# Patient Record
Sex: Male | Born: 1965 | Race: White | Hispanic: No | Smoking: Current every day smoker
Health system: Southern US, Community
[De-identification: ages and names within clinical notes are randomized; demographics above are authoritative.]

## PROBLEM LIST (undated history)

## (undated) DIAGNOSIS — K859 Acute pancreatitis without necrosis or infection, unspecified: Secondary | ICD-10-CM

## (undated) DIAGNOSIS — K4090 Unilateral inguinal hernia, without obstruction or gangrene, not specified as recurrent: Secondary | ICD-10-CM

## (undated) HISTORY — PX: OTHER SURGICAL HISTORY: SHX169

---

## 2004-11-03 ENCOUNTER — Emergency Department: Payer: Self-pay | Admitting: Emergency Medicine

## 2005-02-22 ENCOUNTER — Emergency Department: Payer: Self-pay | Admitting: Emergency Medicine

## 2005-02-28 ENCOUNTER — Emergency Department: Payer: Self-pay | Admitting: Unknown Physician Specialty

## 2005-04-30 ENCOUNTER — Emergency Department: Payer: Self-pay | Admitting: Emergency Medicine

## 2005-09-23 ENCOUNTER — Emergency Department: Payer: Self-pay | Admitting: Emergency Medicine

## 2005-12-08 ENCOUNTER — Emergency Department: Payer: Self-pay | Admitting: Emergency Medicine

## 2005-12-20 ENCOUNTER — Emergency Department: Payer: Self-pay | Admitting: Emergency Medicine

## 2006-01-22 ENCOUNTER — Emergency Department: Payer: Self-pay | Admitting: Emergency Medicine

## 2006-03-14 ENCOUNTER — Emergency Department: Payer: Self-pay | Admitting: Emergency Medicine

## 2006-03-24 ENCOUNTER — Emergency Department: Payer: Self-pay | Admitting: Emergency Medicine

## 2006-07-29 ENCOUNTER — Emergency Department: Payer: Self-pay | Admitting: Emergency Medicine

## 2007-01-01 ENCOUNTER — Emergency Department: Payer: Self-pay | Admitting: Emergency Medicine

## 2007-01-27 ENCOUNTER — Emergency Department: Payer: Self-pay | Admitting: Emergency Medicine

## 2007-05-16 ENCOUNTER — Emergency Department: Payer: Self-pay | Admitting: Emergency Medicine

## 2007-06-15 ENCOUNTER — Emergency Department: Payer: Self-pay | Admitting: Emergency Medicine

## 2007-06-26 ENCOUNTER — Emergency Department: Payer: Self-pay | Admitting: Emergency Medicine

## 2007-07-21 ENCOUNTER — Emergency Department: Payer: Self-pay | Admitting: Emergency Medicine

## 2007-07-22 ENCOUNTER — Emergency Department: Payer: Self-pay | Admitting: Emergency Medicine

## 2007-07-30 ENCOUNTER — Ambulatory Visit: Payer: Self-pay | Admitting: Endocrinology

## 2007-08-31 ENCOUNTER — Ambulatory Visit: Payer: Self-pay | Admitting: Gastroenterology

## 2007-09-16 ENCOUNTER — Ambulatory Visit: Payer: Self-pay | Admitting: Gastroenterology

## 2008-01-04 ENCOUNTER — Emergency Department: Payer: Self-pay | Admitting: Internal Medicine

## 2008-01-14 ENCOUNTER — Emergency Department: Payer: Self-pay | Admitting: Emergency Medicine

## 2008-01-19 ENCOUNTER — Emergency Department: Payer: Self-pay | Admitting: Emergency Medicine

## 2008-01-20 ENCOUNTER — Emergency Department: Payer: Self-pay | Admitting: Internal Medicine

## 2008-03-24 ENCOUNTER — Emergency Department: Payer: Self-pay | Admitting: Emergency Medicine

## 2008-04-11 ENCOUNTER — Emergency Department: Payer: Self-pay | Admitting: Emergency Medicine

## 2008-05-11 ENCOUNTER — Emergency Department: Payer: Self-pay | Admitting: Emergency Medicine

## 2008-07-04 ENCOUNTER — Emergency Department: Payer: Self-pay | Admitting: Emergency Medicine

## 2008-07-27 ENCOUNTER — Emergency Department: Payer: Self-pay | Admitting: Emergency Medicine

## 2009-03-11 ENCOUNTER — Emergency Department: Payer: Self-pay | Admitting: Emergency Medicine

## 2009-05-05 ENCOUNTER — Emergency Department: Payer: Self-pay | Admitting: Emergency Medicine

## 2009-05-26 ENCOUNTER — Emergency Department: Payer: Self-pay | Admitting: Emergency Medicine

## 2009-06-04 ENCOUNTER — Emergency Department: Payer: Self-pay | Admitting: Emergency Medicine

## 2009-06-21 ENCOUNTER — Emergency Department: Payer: Self-pay | Admitting: Emergency Medicine

## 2009-07-12 ENCOUNTER — Emergency Department: Payer: Self-pay | Admitting: Emergency Medicine

## 2009-09-09 ENCOUNTER — Emergency Department: Payer: Self-pay | Admitting: Emergency Medicine

## 2009-11-28 ENCOUNTER — Emergency Department: Payer: Self-pay | Admitting: Emergency Medicine

## 2010-12-26 ENCOUNTER — Emergency Department: Payer: Self-pay | Admitting: Emergency Medicine

## 2013-08-23 ENCOUNTER — Emergency Department: Payer: Self-pay | Admitting: Emergency Medicine

## 2013-08-23 LAB — CBC
MCH: 35.5 pg — ABNORMAL HIGH (ref 26.0–34.0)
MCHC: 35.5 g/dL (ref 32.0–36.0)
MCV: 100 fL (ref 80–100)
Platelet: 221 10*3/uL (ref 150–440)
RBC: 4.35 10*6/uL — ABNORMAL LOW (ref 4.40–5.90)
WBC: 10.5 10*3/uL (ref 3.8–10.6)

## 2013-08-23 LAB — COMPREHENSIVE METABOLIC PANEL
BUN: 19 mg/dL — ABNORMAL HIGH (ref 7–18)
Bilirubin,Total: 0.3 mg/dL (ref 0.2–1.0)
Calcium, Total: 9 mg/dL (ref 8.5–10.1)
Chloride: 105 mmol/L (ref 98–107)
Creatinine: 1.24 mg/dL (ref 0.60–1.30)
Glucose: 103 mg/dL — ABNORMAL HIGH (ref 65–99)
SGPT (ALT): 34 U/L (ref 12–78)
Sodium: 136 mmol/L (ref 136–145)

## 2013-08-23 LAB — TROPONIN I: Troponin-I: <0.02 ng/mL

## 2014-09-12 ENCOUNTER — Emergency Department: Payer: Self-pay | Admitting: Emergency Medicine

## 2014-09-12 LAB — COMPREHENSIVE METABOLIC PANEL
ALBUMIN: 4.1 g/dL (ref 3.4–5.0)
ALT: 29 U/L
Alkaline Phosphatase: 71 U/L
Anion Gap: 10 (ref 7–16)
BUN: 7 mg/dL (ref 7–18)
Bilirubin,Total: 0.3 mg/dL (ref 0.2–1.0)
CALCIUM: 8.7 mg/dL (ref 8.5–10.1)
Chloride: 109 mmol/L — ABNORMAL HIGH (ref 98–107)
Co2: 24 mmol/L (ref 21–32)
Creatinine: 0.84 mg/dL (ref 0.60–1.30)
GLUCOSE: 102 mg/dL — AB (ref 65–99)
OSMOLALITY: 283 (ref 275–301)
Potassium: 3.8 mmol/L (ref 3.5–5.1)
SGOT(AST): 25 U/L (ref 15–37)
SODIUM: 143 mmol/L (ref 136–145)
Total Protein: 7.1 g/dL (ref 6.4–8.2)

## 2014-09-12 LAB — URINALYSIS, COMPLETE
BLOOD: NEGATIVE
Bacteria: NONE SEEN
Bilirubin,UR: NEGATIVE
Glucose,UR: NEGATIVE mg/dL (ref 0–75)
KETONE: NEGATIVE
Leukocyte Esterase: NEGATIVE
Nitrite: NEGATIVE
Ph: 6 (ref 4.5–8.0)
Protein: NEGATIVE
RBC, UR: NONE SEEN /HPF (ref 0–5)
Specific Gravity: 1 (ref 1.003–1.030)
Squamous Epithelial: NONE SEEN
WBC UR: NONE SEEN /HPF (ref 0–5)

## 2014-09-12 LAB — CBC
HCT: 47.9 % (ref 40.0–52.0)
HGB: 15.9 g/dL (ref 13.0–18.0)
MCH: 32.7 pg (ref 26.0–34.0)
MCHC: 33.2 g/dL (ref 32.0–36.0)
MCV: 98 fL (ref 80–100)
Platelet: 259 10*3/uL (ref 150–440)
RBC: 4.87 10*6/uL (ref 4.40–5.90)
RDW: 12.8 % (ref 11.5–14.5)
WBC: 8.8 10*3/uL (ref 3.8–10.6)

## 2014-09-12 LAB — DRUG SCREEN, URINE

## 2014-09-12 LAB — ETHANOL: Ethanol: 213 mg/dL

## 2014-09-12 LAB — SALICYLATE LEVEL: Salicylates, Serum: 4.5 mg/dL — ABNORMAL HIGH

## 2014-09-12 LAB — ACETAMINOPHEN LEVEL

## 2015-01-29 ENCOUNTER — Emergency Department: Payer: Self-pay | Admitting: Emergency Medicine

## 2015-04-06 ENCOUNTER — Emergency Department
Admission: EM | Admit: 2015-04-06 | Discharge: 2015-04-06 | Disposition: A | Discharge status: Home / Self Care | Payer: Self-pay | Attending: Student | Admitting: Student

## 2015-04-06 ENCOUNTER — Encounter: Payer: Self-pay | Admitting: Emergency Medicine

## 2015-04-06 DIAGNOSIS — K852 Alcohol induced acute pancreatitis without necrosis or infection: Secondary | ICD-10-CM

## 2015-04-06 DIAGNOSIS — Z720 Tobacco use: Secondary | ICD-10-CM | POA: Insufficient documentation

## 2015-04-06 LAB — URINALYSIS COMPLETE WITH MICROSCOPIC (ARMC ONLY)
Bacteria, UA: NONE SEEN
Bilirubin Urine: NEGATIVE
Glucose, UA: NEGATIVE mg/dL
Hgb urine dipstick: NEGATIVE
KETONES UR: NEGATIVE mg/dL
Leukocytes, UA: NEGATIVE
Nitrite: NEGATIVE
PH: 7 (ref 5.0–8.0)
PROTEIN: NEGATIVE mg/dL
RBC / HPF: NONE SEEN RBC/hpf (ref 0–5)
SPECIFIC GRAVITY, URINE: 1.013 (ref 1.005–1.030)
Squamous Epithelial / LPF: NONE SEEN

## 2015-04-06 LAB — COMPREHENSIVE METABOLIC PANEL
ALT: 26 U/L (ref 17–63)
AST: 30 U/L (ref 15–41)
Albumin: 4.3 g/dL (ref 3.5–5.0)
Alkaline Phosphatase: 76 U/L (ref 38–126)
Anion gap: 8 (ref 5–15)
BILIRUBIN TOTAL: 0.4 mg/dL (ref 0.3–1.2)
BUN: 14 mg/dL (ref 6–20)
CO2: 26 mmol/L (ref 22–32)
Calcium: 9.1 mg/dL (ref 8.9–10.3)
Chloride: 105 mmol/L (ref 101–111)
Creatinine, Ser: 1.06 mg/dL (ref 0.61–1.24)
Glucose, Bld: 106 mg/dL — ABNORMAL HIGH (ref 65–99)
POTASSIUM: 4.1 mmol/L (ref 3.5–5.1)
SODIUM: 139 mmol/L (ref 135–145)
Total Protein: 6.9 g/dL (ref 6.5–8.1)

## 2015-04-06 LAB — CBC WITH DIFFERENTIAL/PLATELET
BASOS PCT: 1 %
Basophils Absolute: 0.1 10*3/uL (ref 0–0.1)
EOS ABS: 0.3 10*3/uL (ref 0–0.7)
Eosinophils Relative: 4 %
HEMATOCRIT: 44.9 % (ref 40.0–52.0)
Hemoglobin: 15.8 g/dL (ref 13.0–18.0)
Lymphocytes Relative: 32 %
Lymphs Abs: 3.1 10*3/uL (ref 1.0–3.6)
MCH: 34.6 pg — AB (ref 26.0–34.0)
MCHC: 35.3 g/dL (ref 32.0–36.0)
MCV: 98.2 fL (ref 80.0–100.0)
MONOS PCT: 7 %
Monocytes Absolute: 0.6 10*3/uL (ref 0.2–1.0)
NEUTROS ABS: 5.3 10*3/uL (ref 1.4–6.5)
Neutrophils Relative %: 56 %
Platelets: 219 10*3/uL (ref 150–440)
RBC: 4.58 MIL/uL (ref 4.40–5.90)
RDW: 12.4 % (ref 11.5–14.5)
WBC: 9.4 10*3/uL (ref 3.8–10.6)

## 2015-04-06 LAB — LIPASE, BLOOD: LIPASE: 106 U/L — AB (ref 22–51)

## 2015-04-06 MED ORDER — OXYCODONE-ACETAMINOPHEN 5-325 MG PO TABS
2.0000 | ORAL_TABLET | Freq: Once | ORAL | Status: AC
  Administered 2015-04-06: 2 via ORAL

## 2015-04-06 MED ORDER — OXYCODONE-ACETAMINOPHEN 5-325 MG PO TABS
ORAL_TABLET | ORAL | Status: AC
  Filled 2015-04-06: qty 2

## 2015-04-06 NOTE — ED Notes (Signed)
Having left sided abd pain which radiates into groin area for the past 3 days ..denies any n/v or fever

## 2015-04-06 NOTE — Discharge Instructions (Signed)
Return immediately for severe or worsening abdominal pain, vomiting, blood in vomit or stools, fevers, inability to have a bowel movement, chest pain, trouble breathing, or for any other concerns.  Acute Pancreatitis Acute pancreatitis is a disease in which the pancreas becomes suddenly inflamed. The pancreas is a large gland located behind your stomach. The pancreas produces enzymes that help digest food. The pancreas also releases the hormones glucagon and insulin that help regulate blood sugar. Damage to the pancreas occurs when the digestive enzymes from the pancreas are activated and begin attacking the pancreas before being released into the intestine. Most acute attacks last a couple of days and can cause serious complications. Some people become dehydrated and develop low blood pressure. In severe cases, bleeding into the pancreas can lead to shock and can be life-threatening. The lungs, heart, and kidneys may fail. CAUSES  Pancreatitis can happen to anyone. In some cases, the cause is unknown. Most cases are caused by:  Alcohol abuse.  Gallstones. Other less common causes are:  Certain medicines.  Exposure to certain chemicals.  Infection.  Damage caused by an accident (trauma).  Abdominal surgery. SYMPTOMS   Pain in the upper abdomen that may radiate to the back.  Tenderness and swelling of the abdomen.  Nausea and vomiting. DIAGNOSIS  Your caregiver will perform a physical exam. Blood and stool tests may be done to confirm the diagnosis. Imaging tests may also be done, such as X-rays, CT scans, or an ultrasound of the abdomen. TREATMENT  Treatment usually requires a stay in the hospital. Treatment may include:  Pain medicine.  Fluid replacement through an intravenous line (IV).  Placing a tube in the stomach to remove stomach contents and control vomiting.  Not eating for 3 or 4 days. This gives your pancreas a rest, because enzymes are not being produced that can  cause further damage.  Antibiotic medicines if your condition is caused by an infection.  Surgery of the pancreas or gallbladder. HOME CARE INSTRUCTIONS   Follow the diet advised by your caregiver. This may involve avoiding alcohol and decreasing the amount of fat in your diet.  Eat smaller, more frequent meals. This reduces the amount of digestive juices the pancreas produces.  Drink enough fluids to keep your urine clear or pale yellow.  Only take over-the-counter or prescription medicines as directed by your caregiver.  Avoid drinking alcohol if it caused your condition.  Do not smoke.  Get plenty of rest.  Check your blood sugar at home as directed by your caregiver.  Keep all follow-up appointments as directed by your caregiver. SEEK MEDICAL CARE IF:   You do not recover as quickly as expected.  You develop new or worsening symptoms.  You have persistent pain, weakness, or nausea.  You recover and then have another episode of pain. SEEK IMMEDIATE MEDICAL CARE IF:   You are unable to eat or keep fluids down.  Your pain becomes severe.  You have a fever or persistent symptoms for more than 2 to 3 days.  You have a fever and your symptoms suddenly get worse.  Your skin or the white part of your eyes turn yellow (jaundice).  You develop vomiting.  You feel dizzy, or you faint.  Your blood sugar is high (over 300 mg/dL). MAKE SURE YOU:   Understand these instructions.  Will watch your condition.  Will get help right away if you are not doing well or get worse. Document Released: 11/17/2005 Document Revised: 05/18/2012 Document Reviewed:  02/26/2012 ExitCare Patient Information 2015 [place], [place]. This information is not intended to replace advice given to you by your health care provider. Make sure you discuss any questions you have with your health care provider.

## 2015-04-06 NOTE — ED Provider Notes (Signed)
[facility] Emergency Department Provider Note    ____________________________________________  Time seen: ----------------------------------------- 9:04 PM on 04/06/2015 -----------------------------------------   I have reviewed the triage vital signs and the nursing notes.   HISTORY  Chief Complaint Abdominal Pain       HPI Jeffrey Wilkins Labreck is a 49 y.o. male with no chronic medical problems who presents for evaluation of 2-3 days of left-sided abdominal pain. He reports that initially was intermittent and now it is dull, constant and at times radiating into the left groin. Current severity 8 out of 10. No new meds, no recent surgeries, and no exacerbating or alleviating factors. No associated nausea, vomiting, diarrhea, fevers or chills. He is continuing to pass flatus and has had normal bowel movement yesterday. No dysuria or hematuria    History reviewed. No pertinent past medical history.  There are no active problems to display for this patient.   History reviewed. No pertinent past surgical history.  No current outpatient prescriptions on file.  Allergies Review of patient's allergies indicates no known allergies.  History reviewed. No pertinent family history.  Social History History  Substance Use Topics  . Smoking status: Current Every Day Smoker  . Smokeless tobacco: Not on file  . Alcohol Use: No    Review of Systems  Constitutional: Negative for fever. Eyes: Negative for visual changes. ENT: Negative for sore throat. Cardiovascular: Negative for chest pain. Respiratory: Negative for shortness of breath. Gastrointestinal: Positive for abdominal pain; negative for vomiting and diarrhea. Genitourinary: Negative for dysuria. Musculoskeletal: Negative for back pain. Skin: Negative for rash. Neurological: Negative for headaches, focal weakness or numbness.   10-point ROS otherwise  negative.  ____________________________________________   PHYSICAL EXAM:  VITAL SIGNS: ED Triage Vitals  Enc Vitals Group     BP 04/06/15 1412 139/85 mmHg     Pulse Rate 04/06/15 1412 71     Resp 04/06/15 1412 20     Temp 04/06/15 1412 98.2 Wilkins (36.8 C)     Temp Source 04/06/15 1412 Oral     SpO2 04/06/15 1412 98 %     Weight 04/06/15 1412 130 lb (58.968 kg)     Height 04/06/15 1412 5\' 8"  (1.727 m)     Head Cir --      Peak Flow --      Pain Score 04/06/15 1413 8     Pain Loc --      Pain Edu? --      Excl. in GC? --      Constitutional: Alert and oriented. Well appearing and in no distress. Eyes: Conjunctivae are normal. PERRL. Normal extraocular movements. ENT   Head: Normocephalic and atraumatic.   Nose: No congestion/rhinnorhea.   Mouth/Throat: Mucous membranes are moist.   Neck: No stridor. Hematological/Lymphatic/Immunilogical: No cervical lymphadenopathy. Cardiovascular: Normal rate, regular rhythm. Normal and symmetric distal pulses are present in all extremities. No murmurs, rubs, or gallops. Respiratory: Normal respiratory effort without tachypnea nor retractions. Breath sounds are clear and equal bilaterally. No wheezes/rales/rhonchi. Gastrointestinal: Mild to moderate epigastric tenderness and left abdominal tenderness, no rebound, no guarding,  No distention. No abdominal bruits. There is no CVA tenderness. Genitourinary: Normal testicular lie, normal cremaster reflex, testicles are nontender, no hernia sac appreciated/associated with the inguinal ring or testicles Musculoskeletal: Nontender with normal range of motion in all extremities. No joint effusions.  No lower extremity tenderness nor edema. Neurologic:  Normal speech and language. No gross focal neurologic deficits are appreciated. Speech is normal. No  gait instability. Skin:  Skin is warm, dry and intact. No rash noted. Psychiatric: Mood and affect are normal. Speech and behavior are  normal. Patient exhibits appropriate insight and judgment.  ____________________________________________    LABS (pertinent positives/negatives)  Lipase elevated at 109, labs and urinalysis otherwise unremarkable.  ____________________________________________   EKG  None  ____________________________________________    RADIOLOGY  None  ____________________________________________   PROCEDURES  Procedure(s) performed: None  Critical Care performed: No  ____________________________________________   INITIAL IMPRESSION / ASSESSMENT AND PLAN / ED COURSE  Pertinent labs & imaging results that were available during my care of the patient were reviewed by me and considered in my medical decision making (see chart for details).  Jeffrey Wilkins Skalsky is a 49 y.o. male with no chronic medical problems who presents for evaluation of 2-3 days of left-sided abdominal pain. On exam he has mild to moderate tenderness in the epigastrium, faint tenderness elsewhere. Not consistent with hernia, obstruction or perforation. Lipase is elevated. He reports he has not had any alcohol to drink since 04/02/2015 but I told him I'm concerned that this may be the cause of his pancreatitis today. The remainder of his labs are unremarkable, normal bilirubin, doubt obstructing gallstone and he has no right upper quadrant tenderness. I offered admission for pain control however he declined. He is asking for 2 Percocet here and 2 to go as [facility] have prescription for narcotics. We discussed meticulous return precautions and he is comfortable with discharge plan.  ____________________________________________   FINAL CLINICAL IMPRESSION(S) / ED DIAGNOSES  Final diagnoses:  Alcohol-induced acute pancreatitis     Gayla DossEryka A Aaliayah Miao, MD 04/06/15 2318

## 2015-04-06 NOTE — ED Notes (Signed)
Pt denies n/v today.

## 2015-04-09 ENCOUNTER — Emergency Department
Admission: EM | Admit: 2015-04-09 | Discharge: 2015-04-09 | Disposition: A | Discharge status: Home / Self Care | Payer: Self-pay | Attending: Emergency Medicine | Admitting: Emergency Medicine

## 2015-04-09 ENCOUNTER — Emergency Department: Payer: Self-pay

## 2015-04-09 DIAGNOSIS — Z720 Tobacco use: Secondary | ICD-10-CM | POA: Insufficient documentation

## 2015-04-09 DIAGNOSIS — K4090 Unilateral inguinal hernia, without obstruction or gangrene, not specified as recurrent: Secondary | ICD-10-CM | POA: Insufficient documentation

## 2015-04-09 HISTORY — DX: Acute pancreatitis without necrosis or infection, unspecified: K85.90

## 2015-04-09 LAB — URINALYSIS COMPLETE WITH MICROSCOPIC (ARMC ONLY)
Bacteria, UA: NONE SEEN
Bilirubin Urine: NEGATIVE
Glucose, UA: NEGATIVE mg/dL
Hgb urine dipstick: NEGATIVE
Ketones, ur: NEGATIVE mg/dL
Leukocytes, UA: NEGATIVE
Nitrite: NEGATIVE
PH: 7 (ref 5.0–8.0)
PROTEIN: NEGATIVE mg/dL
RBC / HPF: NONE SEEN RBC/hpf (ref 0–5)
SPECIFIC GRAVITY, URINE: 1.004 — AB (ref 1.005–1.030)
Squamous Epithelial / LPF: NONE SEEN

## 2015-04-09 LAB — CBC WITH DIFFERENTIAL/PLATELET
BASOS PCT: 1 %
Basophils Absolute: 0.1 10*3/uL (ref 0–0.1)
Eosinophils Absolute: 0.3 10*3/uL (ref 0–0.7)
Eosinophils Relative: 3 %
HCT: 44.2 % (ref 40.0–52.0)
HEMOGLOBIN: 15.1 g/dL (ref 13.0–18.0)
LYMPHS ABS: 2.6 10*3/uL (ref 1.0–3.6)
LYMPHS PCT: 27 %
MCH: 34 pg (ref 26.0–34.0)
MCHC: 34.2 g/dL (ref 32.0–36.0)
MCV: 99.3 fL (ref 80.0–100.0)
MONO ABS: 0.7 10*3/uL (ref 0.2–1.0)
MONOS PCT: 7 %
Neutro Abs: 6 10*3/uL (ref 1.4–6.5)
Neutrophils Relative %: 62 %
Platelets: 202 10*3/uL (ref 150–440)
RBC: 4.45 MIL/uL (ref 4.40–5.90)
RDW: 12.6 % (ref 11.5–14.5)
WBC: 9.8 10*3/uL (ref 3.8–10.6)

## 2015-04-09 LAB — BASIC METABOLIC PANEL
Anion gap: 8 (ref 5–15)
BUN: 19 mg/dL (ref 6–20)
CHLORIDE: 104 mmol/L (ref 101–111)
CO2: 28 mmol/L (ref 22–32)
CREATININE: 0.98 mg/dL (ref 0.61–1.24)
Calcium: 9.2 mg/dL (ref 8.9–10.3)
GFR calc non Af Amer: >60 mL/min (ref >60)
GLUCOSE: 82 mg/dL (ref 65–99)
Potassium: 4 mmol/L (ref 3.5–5.1)
Sodium: 140 mmol/L (ref 135–145)

## 2015-04-09 LAB — LIPASE, BLOOD: LIPASE: 62 U/L — AB (ref 22–51)

## 2015-04-09 MED ORDER — IOHEXOL 300 MG/ML  SOLN
100.0000 mL | Freq: Once | INTRAMUSCULAR | Status: AC | PRN
  Administered 2015-04-09: 100 mL via INTRAVENOUS

## 2015-04-09 MED ORDER — KETOROLAC TROMETHAMINE 30 MG/ML IJ SOLN
INTRAMUSCULAR | Status: AC
  Administered 2015-04-09: 30 mg via INTRAVENOUS
  Filled 2015-04-09: qty 1

## 2015-04-09 MED ORDER — KETOROLAC TROMETHAMINE 30 MG/ML IJ SOLN
30.0000 mg | Freq: Once | INTRAMUSCULAR | Status: AC
  Administered 2015-04-09: 30 mg via INTRAVENOUS

## 2015-04-09 MED ORDER — OXYCODONE-ACETAMINOPHEN 5-325 MG PO TABS: 1.0000 | ORAL_TABLET | Freq: Four times a day (QID) | ORAL | Status: DC | PRN

## 2015-04-09 MED ORDER — MORPHINE SULFATE 4 MG/ML IJ SOLN
4.0000 mg | Freq: Once | INTRAMUSCULAR | Status: AC
  Administered 2015-04-09: 4 mg via INTRAVENOUS

## 2015-04-09 MED ORDER — MORPHINE SULFATE 4 MG/ML IJ SOLN
INTRAMUSCULAR | Status: AC
  Administered 2015-04-09: 4 mg via INTRAVENOUS
  Filled 2015-04-09: qty 1

## 2015-04-09 NOTE — Discharge Instructions (Signed)
Hernia  Follow-up with [facility]. Make an appointment for this Wednesday or Thursday for follow-up.  Return to the ER right away if your pain returns, he noticed swelling in the groin associated with pain, vomiting, fever, redness, or other new concerns or symptoms arise.  Do not drive tonight and do not drive while taking Percocet. Do not use any alcohol. Use only as prescribed.  A hernia occurs when an internal organ pushes out through a weak spot in the abdominal wall. Hernias most commonly occur in the groin and around the navel. Hernias often can be pushed back into place (reduced). Most hernias tend to get worse over time. Some abdominal hernias can get stuck in the opening (irreducible or incarcerated hernia) and cannot be reduced. An irreducible abdominal hernia which is tightly squeezed into the opening is at risk for impaired blood supply (strangulated hernia). A strangulated hernia is a medical emergency. Because of the risk for an irreducible or strangulated hernia, surgery may be recommended to repair a hernia. CAUSES   Heavy lifting.  Prolonged coughing.  Straining to have a bowel movement.  A cut (incision) made during an abdominal surgery. HOME CARE INSTRUCTIONS   Bed rest is not required. You may continue your normal activities.  Avoid lifting more than 10 pounds (4.5 kg) or straining.  Cough gently. If you are a smoker it is best to stop. Even the best hernia repair can break down with the continual strain of coughing. Even if you do not have your hernia repaired, a cough will continue to aggravate the problem.  Do not wear anything tight over your hernia. Do not try to keep it in with an outside bandage or truss. These can damage abdominal contents if they are trapped within the hernia sac.  Eat a normal diet.  Avoid constipation. Straining over long periods of time will increase hernia size and encourage breakdown of repairs. If you cannot do this with diet alone,  stool softeners may be used. SEEK IMMEDIATE MEDICAL CARE IF:   You have a fever.  You develop increasing abdominal pain.  You feel nauseous or vomit.  Your hernia is stuck outside the abdomen, looks discolored, feels hard, or is tender.  You have any changes in your bowel habits or in the hernia that are unusual for you.  You have increased pain or swelling around the hernia.  You cannot push the hernia back in place by applying gentle pressure while lying down. MAKE SURE YOU:   Understand these instructions.  Will watch your condition.  Will get help right away if you are not doing well or get worse. Document Released: 11/17/2005 Document Revised: 02/09/2012 Document Reviewed: 07/06/2008 [facility] Patient Information 2015 [employer], [place]. This information is not intended to replace advice given to you by your health care provider. Make sure you discuss any questions you have with your health care provider.

## 2015-04-09 NOTE — ED Provider Notes (Signed)
[facility] Provider Note  ____________________________________________  Time seen: Approximately 5:00 PM  I have reviewed the triage vital signs and the nursing notes.   HISTORY  Chief Complaint Abdominal Pain    HPI Jeffrey Wilkins is a 49 y.o. male who developed left lower quadrant pain today, and swelling in the left groin. In addition, patient was seen just a few days ago and told he was having "pancreatitis" (which medical record is reviewed). He states that his nausea vomiting and upper abdominal pain resolved. However, he was working today when he noticed that he had swelling and pain in his left groin. He states that for the last several months he has had occasional pain and swelling in left groin, but today it was much worse. He states he is currently having 10 out of 10 left groin pain.  Quality is described as a "pain" area no recent surgeries. No fevers. No chills. No trouble urinating. No problems using his bathroom or moving his bowels. There is no vomiting today.   Past Medical History  Diagnosis Date  . Pancreatitis     There are no active problems to display for this patient.   No past surgical history on file.  No current outpatient prescriptions on file.  Allergies Review of patient's allergies indicates no known allergies.  No family history on file.  Social History History  Substance Use Topics  . Smoking status: Current Every Day Smoker -- 1.00 packs/day    Types: Cigarettes  . Smokeless tobacco: Never Used  . Alcohol Use: No    Review of Systems Constitutional: No fever/chills Eyes: No visual changes. ENT: No sore throat. Cardiovascular: Denies chest pain. Respiratory: Denies shortness of breath. Gastrointestinal:  No nausea, no vomiting.  No diarrhea.  No constipation. Genitourinary: Negative for dysuria. Musculoskeletal: Negative for back pain. Skin: Negative for rash. Neurological:  Negative for headaches, focal weakness or numbness.  10-point ROS otherwise negative.  ____________________________________________   PHYSICAL EXAM:  VITAL SIGNS: ED Triage Vitals  Enc Vitals Group     BP 04/09/15 1349 126/77 mmHg     Pulse Rate 04/09/15 1349 88     Resp 04/09/15 1349 16     Temp 04/09/15 1349 98.4 F (36.9 C)     Temp Source 04/09/15 1349 Oral     SpO2 04/09/15 1349 98 %     Weight 04/09/15 1349 138 lb (62.596 kg)     Height 04/09/15 1349 5\' 8"  (1.727 m)     Head Cir --      Peak Flow --      Pain Score 04/09/15 1350 9     Pain Loc --      Pain Edu? --      Excl. in GC? --     Constitutional: Alert and oriented. Well appearing and in no acute distress. Eyes: Conjunctivae are normal. PERRL. EOMI. Head: Atraumatic. Nose: No congestion/rhinnorhea. Mouth/Throat: Mucous membranes are moist.  Oropharynx non-erythematous. Neck: No stridor.   Cardiovascular: Normal rate, regular rhythm. Grossly normal heart sounds.  Good peripheral circulation. Respiratory: Normal respiratory effort.  No retractions. Lungs CTAB. Gastrointestinal: There is left lower quadrant abdominal pain, moderate to deep palpation in the left lower quadrant. Remaining quadrants and epigastrium are nontender. There is a mass palpable in the left groin, which descends into the left testicle. This is worse with standing, and appears consistent with a left inguinal hernia. There is no erythema overlying this site, there is no  peritonitis on exam.No distention. No abdominal bruits. No CVA tenderness. Bilateral nontender descended testicles. Circumcised normal-appearing penis. Musculoskeletal: No lower extremity tenderness nor edema.  No joint effusions. Neurologic:  Normal speech and language. No gross focal neurologic deficits are appreciated. Speech is normal. No gait instability. Skin:  Skin is warm, dry and intact. No rash noted. Psychiatric: Mood and affect are normal. Speech and behavior are  normal.  ____________________________________________   LABS (all labs ordered are listed, but only abnormal results are displayed)  Labs Reviewed  LIPASE, BLOOD - Abnormal; Notable for the following:    Lipase 62 (*)    All other components within normal limits  URINALYSIS COMPLETEWITH MICROSCOPIC ([facility])  - Abnormal; Notable for the following:    Color, Urine COLORLESS (*)    APPearance CLEAR (*)    Specific Gravity, Urine 1.004 (*)    All other components within normal limits  BASIC METABOLIC PANEL  CBC WITH DIFFERENTIAL/PLATELET   ____________________________________________  EKG   ____________________________________________  RADIOLOGY  CT abdomen and pelvis with contrast IMPRESSION: *No acute findings are evident in the abdomen or pelvis. There is no CT evidence of pancreatitis. *There is a 1.5 cm low-attenuation left adrenal nodule with benign imaging features. Consider follow-up CT or MRI in 12 months to assure stability. This recommendation follows ACR consensus guidelines: Managing Incidental Findings on Abdominal CT: White Paper of the ACR Incidental Findings Committee. J Am Coll Radiol 2010;7:754-773 ____________________________________________   PROCEDURES  Procedure(s) performed: Left inguinal hernia reduction, see procedure note(s).  Critical Care performed: No   ----------------------------------------- 5:24 PM on 04/09/2015 -----------------------------------------  Patient noted to have swelling and pain in left groin consistent with symptomatic left inguinal hernia. There is no evidence of perforation, or strangulation, as I was able to reduce the hernia under gentle pressure in the Trendelenburg position. Thereafter the patient states his pain is improved in his groin just feels "sore". The swelling has resolved and his pain is improved.    ____________________________________________   INITIAL IMPRESSION / ASSESSMENT AND PLAN / ED  COURSE  Pertinent labs & imaging results that were available during my care of the patient were reviewed by me and considered in my medical decision making (see chart for details).  Abdominal pain left lower quadrant, consistent with inguinal hernia that is likely not incarcerated or strangulate it. It was reducible in the ER with gentle pressure. Patient feels improved. Given the patient does still have some ongoing tenderness in left lower quadrant and recently was evaluated for abdominal pain without imaging, we will obtain CT abdomen and pelvis to evaluate and rule out the possibility of an incarcerated or otherwise acute abdominal process to account for his pain.  ----------------------------------------- 8:28 PM on 04/09/2015 -----------------------------------------  Patient is awake alert, vital stable. He states he feels much better, he just has slight soreness in the left groin. It is no mass, no hernia present.  I discussed with Dr. Michela PitcherEly. We will have the patient follow up in his clinic on Wednesday or Thursday. Patient is agreeable to this plan. In addition discussed left adrenal nodule, patient made aware that he will need follow-up CAT scan in approximately one years to reevaluate and assure that he is not an enlarging tumor.  Return precautions discussed with patient including any severe pain, mass, swelling, redness, fever, vomiting or other concerns. Patient is quite agreeable. ____________________________________________   FINAL CLINICAL IMPRESSION(S) / ED DIAGNOSES  Final diagnoses:  None   Left inguinal hernia, reduced.   Loraine LericheMark  Fanny Bien, MD 04/09/15 2029

## 2015-04-09 NOTE — ED Notes (Signed)
Pt states he has a hx of pancreatitis and was seen here Friday for abd pain..states the sx have not improved with more abd distention ..denies N/V/D..Jeffrey Wilkins

## 2015-05-26 ENCOUNTER — Emergency Department
Admission: EM | Admit: 2015-05-26 | Discharge: 2015-05-26 | Disposition: A | Discharge status: Home / Self Care | Payer: Self-pay

## 2015-05-26 NOTE — ED Notes (Signed)
Pt to STAT desk and states he will come back in the morning.

## 2015-05-30 ENCOUNTER — Emergency Department
Admission: EM | Admit: 2015-05-30 | Discharge: 2015-05-30 | Disposition: A | Discharge status: Home / Self Care | Payer: Self-pay | Attending: Emergency Medicine | Admitting: Emergency Medicine

## 2015-05-30 ENCOUNTER — Encounter: Payer: Self-pay | Admitting: Occupational Medicine

## 2015-05-30 DIAGNOSIS — K4090 Unilateral inguinal hernia, without obstruction or gangrene, not specified as recurrent: Secondary | ICD-10-CM | POA: Insufficient documentation

## 2015-05-30 DIAGNOSIS — Z720 Tobacco use: Secondary | ICD-10-CM | POA: Insufficient documentation

## 2015-05-30 DIAGNOSIS — Z79899 Other long term (current) drug therapy: Secondary | ICD-10-CM | POA: Insufficient documentation

## 2015-05-30 HISTORY — DX: Unilateral inguinal hernia, without obstruction or gangrene, not specified as recurrent: K40.90

## 2015-05-30 LAB — COMPREHENSIVE METABOLIC PANEL
ALBUMIN: 4.7 g/dL (ref 3.5–5.0)
ALK PHOS: 66 U/L (ref 38–126)
ALT: 30 U/L (ref 17–63)
AST: 31 U/L (ref 15–41)
Anion gap: 10 (ref 5–15)
BUN: 9 mg/dL (ref 6–20)
CO2: 23 mmol/L (ref 22–32)
Calcium: 9.3 mg/dL (ref 8.9–10.3)
Chloride: 107 mmol/L (ref 101–111)
Creatinine, Ser: 0.81 mg/dL (ref 0.61–1.24)
GFR calc Af Amer: >60 mL/min (ref >60)
GFR calc non Af Amer: >60 mL/min (ref >60)
Glucose, Bld: 83 mg/dL (ref 65–99)
Potassium: 3.7 mmol/L (ref 3.5–5.1)
SODIUM: 140 mmol/L (ref 135–145)
TOTAL PROTEIN: 7.6 g/dL (ref 6.5–8.1)
Total Bilirubin: 0.7 mg/dL (ref 0.3–1.2)

## 2015-05-30 LAB — CBC
HCT: 46.9 % (ref 40.0–52.0)
HEMOGLOBIN: 16.1 g/dL (ref 13.0–18.0)
MCH: 33.9 pg (ref 26.0–34.0)
MCHC: 34.3 g/dL (ref 32.0–36.0)
MCV: 98.9 fL (ref 80.0–100.0)
Platelets: 255 10*3/uL (ref 150–440)
RBC: 4.74 MIL/uL (ref 4.40–5.90)
RDW: 12.6 % (ref 11.5–14.5)
WBC: 10.4 10*3/uL (ref 3.8–10.6)

## 2015-05-30 LAB — LIPASE, BLOOD: Lipase: 39 U/L (ref 22–51)

## 2015-05-30 MED ORDER — KETOROLAC TROMETHAMINE 60 MG/2ML IM SOLN
INTRAMUSCULAR | Status: AC
Start: 2015-05-30 — End: 2015-05-30
  Administered 2015-05-30: 60 mg via INTRAMUSCULAR
  Filled 2015-05-30: qty 2

## 2015-05-30 MED ORDER — KETOROLAC TROMETHAMINE 60 MG/2ML IM SOLN
60.0000 mg | Freq: Once | INTRAMUSCULAR | Status: AC
  Administered 2015-05-30: 60 mg via INTRAMUSCULAR

## 2015-05-30 NOTE — Discharge Instructions (Signed)

## 2015-05-30 NOTE — ED Provider Notes (Signed)
[facility] Emergency Department Provider Note  ____________________________________________  Time seen: 12:30 AM  I have reviewed the triage vital signs and the nursing notes.   HISTORY  Chief Complaint Inguinal Hernia      HPI Jeffrey Wilkins is a 49 y.o. male presents with complaint of "left inguinal hernia keeps popping in and out, I got pushed back in". Patient seen for the same in the emergency department here at [facility] and referred to general surgery however patient has not followed up. Patient admits to normal bowel movements no nausea no vomiting or fever.     Past Medical History  Diagnosis Date  . Pancreatitis   . Inguinal hernia     There are no active problems to display for this patient.   History reviewed. No pertinent past surgical history.  Current Outpatient Rx  Name  Route  Sig  Dispense  Refill  . ibuprofen (ADVIL,MOTRIN) 200 MG tablet   Oral   Take 400 mg by mouth every 6 (six) hours as needed for moderate pain.         . Multiple Vitamin (MULTIVITAMIN WITH MINERALS) TABS tablet   Oral   Take 1 tablet by mouth daily.         [name] oxyCODONE-acetaminophen (ROXICET) 5-325 MG per tablet   Oral   Take 1 tablet by mouth every 6 (six) hours as needed.   8 tablet   0     Allergies Review of patient's allergies indicates no known allergies.  History reviewed. No pertinent family history.  Social History History  Substance Use Topics  . Smoking status: Current Every Day Smoker -- 1.00 packs/day    Types: Cigarettes  . Smokeless tobacco: Never Used  . Alcohol Use: Yes     Comment: 6 pack a week    Review of Systems  Constitutional: Negative for fever. Eyes: Negative for visual changes. ENT: Negative for sore throat. Cardiovascular: Negative for chest pain. Respiratory: Negative for shortness of breath. Gastrointestinal: Negative for abdominal pain, vomiting and diarrhea. Genitourinary: Negative  for dysuria. Musculoskeletal: Negative for back pain. Skin: Negative for rash. Neurological: Negative for headaches, focal weakness or numbness.  10-point ROS otherwise negative.  ____________________________________________   PHYSICAL EXAM:  VITAL SIGNS: ED Triage Vitals  Enc Vitals Group     BP 05/30/15 0012 142/95 mmHg     Pulse Rate 05/30/15 0012 74     Resp 05/30/15 0012 18     Temp 05/30/15 0012 98.5 F (36.9 C)     Temp Source 05/30/15 0012 Oral     SpO2 05/30/15 0012 97 %     Weight 05/30/15 0012 140 lb (63.504 kg)     Height 05/30/15 0012  (1.727 m)     Head Cir --      Peak Flow --      Pain Score 05/30/15 0013 6     Pain Loc --      Pain Edu? --      Excl. in GC? --     Constitutional: Alert and oriented. Well appearing and in no distress. Eyes: Conjunctivae are normal. PERRL. Normal extraocular movements. ENT   Head: Normocephalic and atraumatic.   Nose: No congestion/rhinnorhea.   Mouth/Throat: Mucous membranes are moist.   Neck: No stridor. Cardiovascular: Normal rate, regular rhythm. Normal and symmetric distal pulses are present in all extremities. No murmurs, rubs, or gallops. Respiratory: Normal respiratory effort without tachypnea nor retractions. Breath sounds are clear and  equal bilaterally. No wheezes/rales/rhonchi. Gastrointestinal: Soft and nontender. No distention. There is no CVA tenderness. Genitourinary: deferred Musculoskeletal: Nontender with normal range of motion in all extremities. No joint effusions.  No lower extremity tenderness nor edema. Neurologic:  Normal speech and language. No gross focal neurologic deficits are appreciated. Speech is normal.  Skin:  Skin is warm, dry and intact. No rash noted. Psychiatric: Mood and affect are normal. Speech and behavior are normal. Patient exhibits appropriate insight and judgment.  ____________________________________________    LABS (pertinent  positives/negatives)         INITIAL IMPRESSION / ASSESSMENT AND PLAN / ED COURSE  Pertinent labs & imaging results that were available during my care of the patient were reviewed by me and considered in my medical decision making (see chart for details).  Patient was found to be smoking in the emergency department restroom. Patient admitted to doing as such and apologized. Given history of pancreatitis on the last 2 visits blood work was sent to the lab however patient requested to leave AMA before return of said tests or any further evaluation  ____________________________________________   FINAL CLINICAL IMPRESSION(S) / ED DIAGNOSES  Final diagnoses:  Unilateral inguinal hernia without obstruction or gangrene, recurrence not specified      Darci Currentandolph N Mistey Hoffert, MD 05/30/15 0111

## 2015-05-30 NOTE — ED Notes (Signed)
Per ems pt from home c/o of left inguinal hernia popping out and having to push back in. Pt hasn't made appt with surgery to get fixed. Pt c/o it painful 6/10 only gets relief for 30 mins when he pushes it back in.

## 2015-06-15 ENCOUNTER — Emergency Department
Admission: EM | Admit: 2015-06-15 | Discharge: 2015-06-15 | Disposition: A | Discharge status: Home / Self Care | Payer: Self-pay | Attending: Emergency Medicine | Admitting: Emergency Medicine

## 2015-06-15 ENCOUNTER — Encounter: Payer: Self-pay | Admitting: *Deleted

## 2015-06-15 DIAGNOSIS — Z79899 Other long term (current) drug therapy: Secondary | ICD-10-CM | POA: Insufficient documentation

## 2015-06-15 DIAGNOSIS — Z720 Tobacco use: Secondary | ICD-10-CM | POA: Insufficient documentation

## 2015-06-15 DIAGNOSIS — K4091 Unilateral inguinal hernia, without obstruction or gangrene, recurrent: Secondary | ICD-10-CM | POA: Insufficient documentation

## 2015-06-15 LAB — CBC
HCT: 45.9 % (ref 40.0–52.0)
HEMOGLOBIN: 15.8 g/dL (ref 13.0–18.0)
MCH: 33.5 pg (ref 26.0–34.0)
MCHC: 34.5 g/dL (ref 32.0–36.0)
MCV: 97.1 fL (ref 80.0–100.0)
Platelets: 282 10*3/uL (ref 150–440)
RBC: 4.72 MIL/uL (ref 4.40–5.90)
RDW: 12.5 % (ref 11.5–14.5)
WBC: 11.3 10*3/uL — AB (ref 3.8–10.6)

## 2015-06-15 LAB — COMPREHENSIVE METABOLIC PANEL
ALK PHOS: 80 U/L (ref 38–126)
ALT: 22 U/L (ref 17–63)
AST: 26 U/L (ref 15–41)
Albumin: 4.8 g/dL (ref 3.5–5.0)
Anion gap: 13 (ref 5–15)
BUN: 15 mg/dL (ref 6–20)
CO2: 21 mmol/L — AB (ref 22–32)
Calcium: 9.2 mg/dL (ref 8.9–10.3)
Chloride: 105 mmol/L (ref 101–111)
Creatinine, Ser: 0.75 mg/dL (ref 0.61–1.24)
GFR calc Af Amer: >60 mL/min (ref >60)
GFR calc non Af Amer: >60 mL/min (ref >60)
Glucose, Bld: 94 mg/dL (ref 65–99)
POTASSIUM: 3.8 mmol/L (ref 3.5–5.1)
Sodium: 139 mmol/L (ref 135–145)
Total Bilirubin: 0.4 mg/dL (ref 0.3–1.2)
Total Protein: 7.7 g/dL (ref 6.5–8.1)

## 2015-06-15 MED ORDER — OXYCODONE-ACETAMINOPHEN 5-325 MG PO TABS
1.0000 | ORAL_TABLET | Freq: Once | ORAL | Status: AC
  Administered 2015-06-15: 1 via ORAL
  Filled 2015-06-15: qty 1

## 2015-06-15 MED ORDER — OXYCODONE-ACETAMINOPHEN 5-325 MG PO TABS: 1.0000 | ORAL_TABLET | ORAL | Status: DC | PRN

## 2015-06-15 NOTE — ED Provider Notes (Signed)
[facility] Emergency Department Provider Note  ____________________________________________  Time seen: Approximately 4:20 AM  I have reviewed the triage vital signs and the nursing notes.   HISTORY  Chief Complaint Hernia    HPI Jeffrey Wilkins is a 49 y.o. male presents to the ED via EMS for hernia pain. Patient has known left inguinal hernia, seen in the ED for same previously times several episodes, has not followed up with surgeon as recommended. Patient states his hernia "bulged out" this evening which he was able to reduce himself. Complains of 5/10 pain to left groin. States he had a subjective fever yesterday morning. Took Motrin prior to arrival for pain. Denies chest pain, shortness of breath, abdominal pain, nausea, vomiting.Nothing makes the pain better. Straining makes the pain worse.   Past Medical History  Diagnosis Date  . Pancreatitis   . Inguinal hernia     There are no active problems to display for this patient.   History reviewed. No pertinent past surgical history.  Current Outpatient Rx  Name  Route  Sig  Dispense  Refill  . ibuprofen (ADVIL,MOTRIN) 200 MG tablet   Oral   Take 400 mg by mouth every 6 (six) hours as needed for moderate pain.         . Multiple Vitamin (MULTIVITAMIN WITH MINERALS) TABS tablet   Oral   Take 1 tablet by mouth daily.         [name] oxyCODONE-acetaminophen (ROXICET) 5-325 MG per tablet   Oral   Take 1 tablet by mouth every 6 (six) hours as needed.   8 tablet   0     Allergies Review of patient's allergies indicates no known allergies.  No family history on file.  Social History History  Substance Use Topics  . Smoking status: Current Every Day Smoker -- 1.00 packs/day    Types: Cigarettes  . Smokeless tobacco: Never Used  . Alcohol Use: Yes     Comment: 6 pack a week    Review of Systems Constitutional: No fever/chills Eyes: No visual changes. ENT: No sore  throat. Cardiovascular: Denies chest pain. Respiratory: Denies shortness of breath. Gastrointestinal: No abdominal pain.  No nausea, no vomiting.  No diarrhea.  No constipation. Genitourinary: Positive for hernia pain. Negative for dysuria. Musculoskeletal: Negative for back pain. Skin: Negative for rash. Neurological: Negative for headaches, focal weakness or numbness.  10-point ROS otherwise negative.  ____________________________________________   PHYSICAL EXAM:  VITAL SIGNS: ED Triage Vitals  Enc Vitals Group     BP 06/15/15 0127 152/91 mmHg     Pulse Rate 06/15/15 0127 86     Resp 06/15/15 0127 18     Temp 06/15/15 0127 98.1 F (36.7 C)     Temp Source 06/15/15 0127 Oral     SpO2 06/15/15 0127 95 %     Weight 06/15/15 0127 151 lb (68.493 kg)     Height 06/15/15 0127  (1.727 m)     Head Cir --      Peak Flow --      Pain Score 06/15/15 0128 8     Pain Loc --      Pain Edu? --      Excl. in GC? --     Constitutional: Alert and oriented. Well appearing and in no acute distress. Eyes: Conjunctivae are normal. PERRL. EOMI. Head: Atraumatic. Nose: No congestion/rhinnorhea. Mouth/Throat: Mucous membranes are moist.  Oropharynx non-erythematous. Neck: No stridor.   Cardiovascular: Normal rate, regular  rhythm. Grossly normal heart sounds.  Good peripheral circulation. Respiratory: Normal respiratory effort.  No retractions. Lungs CTAB. Gastrointestinal: Soft and nontender. No distention. No abdominal bruits. No CVA tenderness. Genitourinary: Examined with patient reclined. Left 2+ femoral pulse. Slightly tender to palpation left inguinal canal which is without palpable mass. No left testicular tenderness, swelling or mass. Patient declined upright exam. Musculoskeletal: No lower extremity tenderness nor edema.  No joint effusions. Neurologic:  Normal speech and language. No gross focal neurologic deficits are appreciated. No gait instability. Skin:  Skin is warm, dry  and intact. No rash noted. Psychiatric: Mood and affect are normal. Speech and behavior are normal.  ____________________________________________   LABS (all labs ordered are listed, but only abnormal results are displayed)  Labs Reviewed  COMPREHENSIVE METABOLIC PANEL - Abnormal; Notable for the following:    CO2 21 (*)    All other components within normal limits  CBC - Abnormal; Notable for the following:    WBC 11.3 (*)    All other components within normal limits   ____________________________________________  EKG  None ____________________________________________  RADIOLOGY  None ____________________________________________   PROCEDURES  Procedure(s) performed: None  Critical Care performed: No  ____________________________________________   INITIAL IMPRESSION / ASSESSMENT AND PLAN / ED COURSE  Pertinent labs & imaging results that were available during my care of the patient were reviewed by me and considered in my medical decision making (see chart for details).  49 year old male who presents with left inguinal hernia pain s/p reduction prior to arrival. There is no concern for strangulated or incarcerated hernia on my exam. Patient is resting comfortably without acute distress. I had an extensive discussion with patient and strongly encouraged him to follow up with general surgery. Limited opiate prescription written after patient query on narcotics database. Strict return precautions given. Patient verbalizes understanding and agrees with plan of care. ____________________________________________   FINAL CLINICAL IMPRESSION(S) / ED DIAGNOSES  Final diagnoses:  Unilateral recurrent inguinal hernia without obstruction or gangrene      Irean HongJade J Sung, MD 06/15/15 [phone]

## 2015-06-15 NOTE — ED Notes (Addendum)
Pt arrives ambulatory to triage via ems. Pt reports left groin and lower abdominal pain. Pt has known inguinal hernia, seen here for the same, has not followed up with the surgeon. Denies n/v/d. Per report from EMS, the pt vss en route.
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
## 2015-06-15 NOTE — ED Notes (Signed)
Pt presents to ED with c/o abdominal pain r/t a hernia in the groin area. Pt states the pain does not radiate into the lower extrimeties, but is causing significant abdominal pain. Pt denies N/V, but (+) for fever. Pt reports taking IBU for the fever around 115 am this morning. Pt is A&O, in NAD, resting comfortably in the ED stretcher.

## 2015-06-15 NOTE — Discharge Instructions (Signed)
1. You must make an appointment with the surgeon to further evaluate your hernia. 2. Take pain medicine as needed (Percocet #10). 3. Return to the ER for worsening symptoms, persistent vomiting or other concerns.  Hernia A hernia occurs when an internal organ pushes out through a weak spot in the abdominal wall. Hernias most commonly occur in the groin and around the navel. Hernias often can be pushed back into place (reduced). Most hernias tend to get worse over time. Some abdominal hernias can get stuck in the opening (irreducible or incarcerated hernia) and cannot be reduced. An irreducible abdominal hernia which is tightly squeezed into the opening is at risk for impaired blood supply (strangulated hernia). A strangulated hernia is a medical emergency. Because of the risk for an irreducible or strangulated hernia, surgery may be recommended to repair a hernia. CAUSES   Heavy lifting.  Prolonged coughing.  Straining to have a bowel movement.  A cut (incision) made during an abdominal surgery. HOME CARE INSTRUCTIONS   Bed rest is not required. You may continue your normal activities.  Avoid lifting more than 10 pounds (4.5 kg) or straining.  Cough gently. If you are a smoker it is best to stop. Even the best hernia repair can break down with the continual strain of coughing. Even if you do not have your hernia repaired, a cough will continue to aggravate the problem.  Do not wear anything tight over your hernia. Do not try to keep it in with an outside bandage or truss. These can damage abdominal contents if they are trapped within the hernia sac.  Eat a normal diet.  Avoid constipation. Straining over long periods of time will increase hernia size and encourage breakdown of repairs. If you cannot do this with diet alone, stool softeners may be used. SEEK IMMEDIATE MEDICAL CARE IF:   You have a fever.  You develop increasing abdominal pain.  You feel nauseous or vomit.  Your  hernia is stuck outside the abdomen, looks discolored, feels hard, or is tender.  You have any changes in your bowel habits or in the hernia that are unusual for you.  You have increased pain or swelling around the hernia.  You cannot push the hernia back in place by applying gentle pressure while lying down. MAKE SURE YOU:   Understand these instructions.  Will watch your condition.  Will get help right away if you are not doing well or get worse. Document Released: 11/17/2005 Document Revised: 02/09/2012 Document Reviewed: 07/06/2008 [facility] Patient Information 2015 [place]. This information is not intended to replace advice given to you by your health care provider. Make sure you discuss any questions you have with your health care provider.

## 2015-06-21 ENCOUNTER — Encounter: Payer: Self-pay | Admitting: Emergency Medicine

## 2015-06-21 ENCOUNTER — Emergency Department
Admission: EM | Admit: 2015-06-21 | Discharge: 2015-06-21 | Disposition: A | Discharge status: Home / Self Care | Payer: Self-pay | Attending: Emergency Medicine | Admitting: Emergency Medicine

## 2015-06-21 DIAGNOSIS — K4090 Unilateral inguinal hernia, without obstruction or gangrene, not specified as recurrent: Secondary | ICD-10-CM | POA: Insufficient documentation

## 2015-06-21 DIAGNOSIS — Z79899 Other long term (current) drug therapy: Secondary | ICD-10-CM | POA: Insufficient documentation

## 2015-06-21 DIAGNOSIS — Z720 Tobacco use: Secondary | ICD-10-CM | POA: Insufficient documentation

## 2015-06-21 MED ORDER — KETOROLAC TROMETHAMINE 10 MG PO TABS: 10.0000 mg | ORAL_TABLET | Freq: Three times a day (TID) | ORAL | Status: DC | PRN

## 2015-06-21 MED ORDER — KETOROLAC TROMETHAMINE 10 MG PO TABS
10.0000 mg | ORAL_TABLET | Freq: Once | ORAL | Status: AC
  Administered 2015-06-21: 10 mg via ORAL

## 2015-06-21 NOTE — ED Notes (Signed)
No answer when called from lobby 

## 2015-06-21 NOTE — ED Notes (Signed)
Pt noted walking in and out of ED lobby at frequent intervals; sitting in lobby turning channels on TV, walking to other side of lobby and doing same

## 2015-06-21 NOTE — ED Provider Notes (Signed)
[facility] Emergency Department Provider Note  ____________________________________________  Time seen: 5:10 AM  I have reviewed the triage vital signs and the nursing notes.   HISTORY  Chief Complaint No chief complaint on file.     HPI Jeffrey Wilkins is a 49 y.o. male presents with left inguinal pain with onset 11:00 PM last night patient states he has a history of an inguinal hernia on the left and has an appointment with the surgeon Dr. Michela Pitcher scheduled for Wednesday of next week. Patient states that area was bulging but that he was able to push it back in. Patient requesting something for pain.      Past Medical History  Diagnosis Date  . Pancreatitis   . Inguinal hernia     There are no active problems to display for this patient.   History reviewed. No pertinent past surgical history.  Current Outpatient Rx  Name  Route  Sig  Dispense  Refill  . ibuprofen (ADVIL,MOTRIN) 200 MG tablet   Oral   Take 400 mg by mouth every 6 (six) hours as needed for moderate pain.         . Multiple Vitamin (MULTIVITAMIN WITH MINERALS) TABS tablet   Oral   Take 1 tablet by mouth daily.         [name] oxyCODONE-acetaminophen (ROXICET) 5-325 MG per tablet   Oral   Take 1 tablet by mouth every 4 (four) hours as needed for severe pain.   10 tablet   0     Allergies Review of patient's allergies indicates no known allergies.  No family history on file.  Social History History  Substance Use Topics  . Smoking status: Current Every Day Smoker -- 1.00 packs/day    Types: Cigarettes  . Smokeless tobacco: Never Used  . Alcohol Use: Yes     Comment: 6 pack a week    Review of Systems  Constitutional: Negative for fever. Eyes: Negative for visual changes. ENT: Negative for sore throat. Cardiovascular: Negative for chest pain. Respiratory: Negative for shortness of breath. Gastrointestinal: Negative for abdominal pain, vomiting and  diarrhea. Genitourinary: Negative for dysuria. Musculoskeletal: Negative for back pain. Skin: Negative for rash. Neurological: Negative for headaches, focal weakness or numbness.   10-point ROS otherwise negative.  ____________________________________________   PHYSICAL EXAM:  VITAL SIGNS: ED Triage Vitals  Enc Vitals Group     BP 06/21/15 0339 133/89 mmHg     Pulse Rate 06/21/15 0339 92     Resp 06/21/15 0339 18     Temp 06/21/15 0339 97.9 F (36.6 C)     Temp Source 06/21/15 0339 Oral     SpO2 06/21/15 0339 97 %     Weight 06/21/15 0339 135 lb (61.236 kg)     Height 06/21/15 0339  (1.727 m)     Head Cir --      Peak Flow --      Pain Score 06/21/15 0339 8     Pain Loc --      Pain Edu? --      Excl. in GC? --      Constitutional: Alert and oriented. Well appearing and in no distress. Eyes: Conjunctivae are normal. PERRL. Normal extraocular movements. ENT   Head: Normocephalic and atraumatic.   Nose: No congestion/rhinnorhea.   Mouth/Throat: Mucous membranes are moist.   Neck: No stridor. Hematological/Lymphatic/Immunilogical: No cervical lymphadenopathy. Cardiovascular: Normal rate, regular rhythm. Normal and symmetric distal pulses are present in all extremities.  No murmurs, rubs, or gallops. Respiratory: Normal respiratory effort without tachypnea nor retractions. Breath sounds are clear and equal bilaterally. No wheezes/rales/rhonchi. Gastrointestinal: Soft and nontender. No distention. There is no CVA tenderness. Genitourinary: deferred Musculoskeletal: Nontender with normal range of motion in all extremities. No joint effusions.  No lower extremity tenderness nor edema. Neurologic:  Normal speech and language. No gross focal neurologic deficits are appreciated. Speech is normal.  Skin:  Skin is warm, dry and intact. No rash noted. Psychiatric: Mood and affect are normal. Speech and behavior are normal. Patient exhibits appropriate insight and  judgment.  ____________________________________________      INITIAL IMPRESSION / ASSESSMENT AND PLAN / ED COURSE  Pertinent labs & imaging results that were available during my care of the patient were reviewed by me and considered in my medical decision making (see chart for details).  After reviewing patient's chart patient has been seen 6 times in 6 months in the emergency department for the same complaint. Most recent of which was July 15 at that point the patient was prescribed Percocet. I informed the patient that I would give him a nonnarcotic for his pain. However that I would  not administer narcotic at this time. patient became irate stating that he could go to [facility] or return to the emergency department tomorrow and get it.  ____________________________________________   FINAL CLINICAL IMPRESSION(S) / ED DIAGNOSES  Final diagnoses:  Inguinal hernia, left      Darci Current, MD 06/22/15 [phone]

## 2015-06-21 NOTE — ED Notes (Addendum)
Patient ambulatory to triage with steady gait, without difficulty or distress noted; pt reports has left inguinal hernia and having increased pain tonight; st sched for surgery Wednesday with Dr Michela Pitcher; denies any accomp symptoms

## 2015-06-21 NOTE — ED Notes (Signed)
MD at the bedside seen pt ordering pain meds but it isnt the med the pt wants

## 2015-06-21 NOTE — Discharge Instructions (Signed)

## 2015-08-18 ENCOUNTER — Encounter: Payer: Self-pay | Admitting: *Deleted

## 2015-08-18 ENCOUNTER — Emergency Department
Admission: EM | Admit: 2015-08-18 | Discharge: 2015-08-18 | Disposition: A | Discharge status: Home / Self Care | Payer: Self-pay | Attending: Emergency Medicine | Admitting: Emergency Medicine

## 2015-08-18 ENCOUNTER — Emergency Department: Payer: Self-pay

## 2015-08-18 DIAGNOSIS — Y9289 Other specified places as the place of occurrence of the external cause: Secondary | ICD-10-CM | POA: Insufficient documentation

## 2015-08-18 DIAGNOSIS — Y998 Other external cause status: Secondary | ICD-10-CM | POA: Insufficient documentation

## 2015-08-18 DIAGNOSIS — S3991XA Unspecified injury of abdomen, initial encounter: Secondary | ICD-10-CM | POA: Insufficient documentation

## 2015-08-18 DIAGNOSIS — K4090 Unilateral inguinal hernia, without obstruction or gangrene, not specified as recurrent: Secondary | ICD-10-CM | POA: Insufficient documentation

## 2015-08-18 DIAGNOSIS — R1032 Left lower quadrant pain: Secondary | ICD-10-CM

## 2015-08-18 DIAGNOSIS — Z720 Tobacco use: Secondary | ICD-10-CM | POA: Insufficient documentation

## 2015-08-18 DIAGNOSIS — Y9389 Activity, other specified: Secondary | ICD-10-CM | POA: Insufficient documentation

## 2015-08-18 LAB — COMPREHENSIVE METABOLIC PANEL
ALBUMIN: 4.4 g/dL (ref 3.5–5.0)
ALT: 21 U/L (ref 17–63)
AST: 26 U/L (ref 15–41)
Alkaline Phosphatase: 54 U/L (ref 38–126)
Anion gap: 7 (ref 5–15)
BUN: 9 mg/dL (ref 6–20)
CHLORIDE: 110 mmol/L (ref 101–111)
CO2: 29 mmol/L (ref 22–32)
Calcium: 9 mg/dL (ref 8.9–10.3)
Creatinine, Ser: 0.88 mg/dL (ref 0.61–1.24)
GFR calc Af Amer: >60 mL/min (ref >60)
Glucose, Bld: 91 mg/dL (ref 65–99)
Potassium: 4.3 mmol/L (ref 3.5–5.1)
Sodium: 146 mmol/L — ABNORMAL HIGH (ref 135–145)
Total Bilirubin: 0.6 mg/dL (ref 0.3–1.2)
Total Protein: 7.4 g/dL (ref 6.5–8.1)

## 2015-08-18 LAB — CBC WITH DIFFERENTIAL/PLATELET
BASOS ABS: 0.1 10*3/uL (ref 0–0.1)
Basophils Relative: 1 %
Eosinophils Absolute: 0.3 10*3/uL (ref 0–0.7)
Eosinophils Relative: 3 %
HEMATOCRIT: 49.1 % (ref 40.0–52.0)
HEMOGLOBIN: 16.8 g/dL (ref 13.0–18.0)
LYMPHS PCT: 35 %
Lymphs Abs: 3.5 10*3/uL (ref 1.0–3.6)
MCH: 33.9 pg (ref 26.0–34.0)
MCHC: 34.2 g/dL (ref 32.0–36.0)
MCV: 99.1 fL (ref 80.0–100.0)
MONO ABS: 0.8 10*3/uL (ref 0.2–1.0)
Monocytes Relative: 8 %
NEUTROS ABS: 5.4 10*3/uL (ref 1.4–6.5)
Neutrophils Relative %: 53 %
Platelets: 306 10*3/uL (ref 150–440)
RBC: 4.96 MIL/uL (ref 4.40–5.90)
RDW: 12.9 % (ref 11.5–14.5)
WBC: 10 10*3/uL (ref 3.8–10.6)

## 2015-08-18 LAB — ETHANOL: Alcohol, Ethyl (B): 206 mg/dL — ABNORMAL HIGH (ref <5)

## 2015-08-18 MED ORDER — IOHEXOL 300 MG/ML  SOLN: 100.0000 mL | Freq: Once | INTRAMUSCULAR | Status: DC | PRN

## 2015-08-18 MED ORDER — SODIUM CHLORIDE 0.9 % IV BOLUS (SEPSIS)
1000.0000 mL | Freq: Once | INTRAVENOUS | Status: AC
  Administered 2015-08-18: 1000 mL via INTRAVENOUS

## 2015-08-18 MED ORDER — METOCLOPRAMIDE HCL 5 MG/ML IJ SOLN
5.0000 mg | Freq: Once | INTRAMUSCULAR | Status: AC
  Administered 2015-08-18: 5 mg via INTRAVENOUS
  Filled 2015-08-18: qty 2

## 2015-08-18 NOTE — Discharge Instructions (Signed)
Your CT did not show any evidence of acute injury requiring assistance currently. If you have worsening abdominal pain, if you have nausea and vomiting, or if you have other concerns, return to the emergency department. This includes if you're hernia on the left bulges out again and cannot go back up. Follow-up with the doctors who you have been seeing regarding your hernia. If this is not [facility], you may contact them as well for further care.  Abdominal Pain Many things can cause belly (abdominal) pain. Most times, the belly pain is not dangerous. Many cases of belly pain can be watched and treated at home. HOME CARE   Do not take medicines that help you go poop (laxatives) unless told to by your doctor.  Only take medicine as told by your doctor.  Eat or drink as told by your doctor. Your doctor will tell you if you should be on a special diet. GET HELP IF:  You do not know what is causing your belly pain.  You have belly pain while you are sick to your stomach (nauseous) or have runny poop (diarrhea).  You have pain while you pee or poop.  Your belly pain wakes you up at night.  You have belly pain that gets worse or better when you eat.  You have belly pain that gets worse when you eat fatty foods.  You have a fever. GET HELP RIGHT AWAY IF:   The pain does not go away within 2 hours.  You keep throwing up (vomiting).  The pain changes and is only in the right or left part of the belly.  You have bloody or tarry looking poop. MAKE SURE YOU:   Understand these instructions.  Will watch your condition.  Will get help right away if you are not doing well or get worse. Document Released: 05/05/2008 Document Revised: 11/22/2013 Document Reviewed: 07/27/2013 [facility] Patient Information 2015 [place], [place]. This information is not intended to replace advice given to you by your health care provider. Make sure you discuss any questions you have with your health  care provider.  Inguinal Hernia, Adult Muscles help keep everything in the body in its proper place. But if a weak spot in the muscles develops, something can poke through. That is called a hernia. When this happens in the lower part of the belly (abdomen), it is called an inguinal hernia. (It takes its name from a part of the body in this region called the inguinal canal.) A weak spot in the wall of muscles lets some fat or part of the small intestine bulge through. An inguinal hernia can develop at any age. Men get them more often than women. CAUSES  In adults, an inguinal hernia develops over time.  It can be triggered by:  Suddenly straining the muscles of the lower abdomen.  Lifting heavy objects.  Straining to have a bowel movement. Difficult bowel movements (constipation) can lead to this.  Constant coughing. This may be caused by smoking or lung disease.  Being overweight.  Being pregnant.  Working at a job that requires long periods of standing or heavy lifting.  Having had an inguinal hernia before. One type can be an emergency situation. It is called a strangulated inguinal hernia. It develops if part of the small intestine slips through the weak spot and cannot get back into the abdomen. The blood supply can be cut off. If that happens, part of the intestine may die. This situation requires emergency surgery.  SYMPTOMS  Often, a small inguinal hernia has no symptoms. It is found when a healthcare provider does a physical exam. Larger hernias usually have symptoms.   In adults, symptoms may include:  A lump in the groin. This is easier to see when the person is standing. It might disappear when lying down.  In men, a lump in the scrotum.  Pain or burning in the groin. This occurs especially when lifting, straining or coughing.  A dull ache or feeling of pressure in the groin.  Signs of a strangulated hernia can include:  A bulge in the groin that becomes very painful  and tender to the touch.  A bulge that turns red or purple.  Fever, nausea and vomiting.  Inability to have a bowel movement or to pass gas. DIAGNOSIS  To decide if you have an inguinal hernia, a healthcare provider will probably do a physical examination.  This will include asking questions about any symptoms you have noticed.  The healthcare provider might feel the groin area and ask you to cough. If an inguinal hernia is felt, the healthcare provider may try to slide it back into the abdomen.  Usually no other tests are needed. TREATMENT  Treatments can vary. The size of the hernia makes a difference. Options include:  Watchful waiting. This is often suggested if the hernia is small and you have had no symptoms.  No medical procedure will be done unless symptoms develop.  You will need to watch closely for symptoms. If any occur, contact your healthcare provider right away.  Surgery. This is used if the hernia is larger or you have symptoms.  Open surgery. This is usually an outpatient procedure (you will not stay overnight in a hospital). An cut (incision) is made through the skin in the groin. The hernia is put back inside the abdomen. The weak area in the muscles is then repaired by herniorrhaphy or hernioplasty. Herniorrhaphy: in this type of surgery, the weak muscles are sewn back together. Hernioplasty: a patch or mesh is used to close the weak area in the abdominal wall.  Laparoscopy. In this procedure, a surgeon makes small incisions. A thin tube with a tiny video camera (called a laparoscope) is put into the abdomen. The surgeon repairs the hernia with mesh by looking with the video camera and using two long instruments. HOME CARE INSTRUCTIONS   After surgery to repair an inguinal hernia:  You will need to take pain medicine prescribed by your healthcare provider. Follow all directions carefully.  You will need to take care of the wound from the incision.  Your  activity will be restricted for awhile. This will probably include no heavy lifting for several weeks. You also should not do anything too active for a few weeks. When you can return to work will depend on the type of job that you have.  During "watchful waiting" periods, you should:  Maintain a healthy weight.  Eat a diet high in fiber (fruits, vegetables and whole grains).  Drink plenty of fluids to avoid constipation. This means drinking enough water and other liquids to keep your urine clear or pale yellow.  Do not lift heavy objects.  Do not stand for long periods of time.  Quit smoking. This should keep you from developing a frequent cough. SEEK MEDICAL CARE IF:   A bulge develops in your groin area.  You feel pain, a burning sensation or pressure in the groin. This might be worse if you are  lifting or straining.  You develop a fever of more than 100.5 F (38.1 C). SEEK IMMEDIATE MEDICAL CARE IF:   Pain in the groin increases suddenly.  A bulge in the groin gets bigger suddenly and does not go down.  For men, there is sudden pain in the scrotum. Or, the size of the scrotum increases.  A bulge in the groin area becomes red or purple and is painful to touch.  You have nausea or vomiting that does not go away.  You feel your heart beating much faster than normal.  You cannot have a bowel movement or pass gas.  You develop a fever of more than 102.0 F (38.9 C). Document Released: 04/05/2009 Document Revised: 02/09/2012 Document Reviewed: 04/05/2009 [facility] Patient Information 2015 [place], [place]. This information is not intended to replace advice given to you by your health care provider. Make sure you discuss any questions you have with your health care provider.
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
## 2015-11-27 ENCOUNTER — Ambulatory Visit: Payer: Self-pay | Admitting: Surgery

## 2015-11-29 ENCOUNTER — Emergency Department
Admission: EM | Admit: 2015-11-29 | Discharge: 2015-11-29 | Disposition: A | Discharge status: Home / Self Care | Payer: Self-pay | Attending: Emergency Medicine | Admitting: Emergency Medicine

## 2015-11-29 ENCOUNTER — Encounter: Payer: Self-pay | Admitting: *Deleted

## 2015-11-29 ENCOUNTER — Emergency Department: Payer: Self-pay

## 2015-11-29 DIAGNOSIS — F17210 Nicotine dependence, cigarettes, uncomplicated: Secondary | ICD-10-CM | POA: Insufficient documentation

## 2015-11-29 DIAGNOSIS — K4091 Unilateral inguinal hernia, without obstruction or gangrene, recurrent: Secondary | ICD-10-CM | POA: Insufficient documentation

## 2015-11-29 DIAGNOSIS — R1032 Left lower quadrant pain: Secondary | ICD-10-CM

## 2015-11-29 DIAGNOSIS — R0602 Shortness of breath: Secondary | ICD-10-CM | POA: Insufficient documentation

## 2015-11-29 DIAGNOSIS — Z79899 Other long term (current) drug therapy: Secondary | ICD-10-CM | POA: Insufficient documentation

## 2015-11-29 LAB — CBC WITH DIFFERENTIAL/PLATELET
BASOS ABS: 0.1 10*3/uL (ref 0–0.1)
Basophils Relative: 1 %
EOS ABS: 0.3 10*3/uL (ref 0–0.7)
EOS PCT: 3 %
HCT: 44.5 % (ref 40.0–52.0)
Hemoglobin: 15.5 g/dL (ref 13.0–18.0)
LYMPHS ABS: 4.3 10*3/uL — AB (ref 1.0–3.6)
Lymphocytes Relative: 41 %
MCH: 34.2 pg — ABNORMAL HIGH (ref 26.0–34.0)
MCHC: 34.8 g/dL (ref 32.0–36.0)
MCV: 98.1 fL (ref 80.0–100.0)
Monocytes Absolute: 0.7 10*3/uL (ref 0.2–1.0)
Monocytes Relative: 7 %
NEUTROS ABS: 5 10*3/uL (ref 1.4–6.5)
NEUTROS PCT: 48 %
PLATELETS: 244 10*3/uL (ref 150–440)
RBC: 4.54 MIL/uL (ref 4.40–5.90)
RDW: 12.4 % (ref 11.5–14.5)
WBC: 10.4 10*3/uL (ref 3.8–10.6)

## 2015-11-29 LAB — COMPREHENSIVE METABOLIC PANEL
ALBUMIN: 4.6 g/dL (ref 3.5–5.0)
ALT: 22 U/L (ref 17–63)
AST: 24 U/L (ref 15–41)
Alkaline Phosphatase: 62 U/L (ref 38–126)
Anion gap: 8 (ref 5–15)
BUN: 11 mg/dL (ref 6–20)
CHLORIDE: 109 mmol/L (ref 101–111)
CO2: 26 mmol/L (ref 22–32)
Calcium: 9 mg/dL (ref 8.9–10.3)
Creatinine, Ser: 0.76 mg/dL (ref 0.61–1.24)
GFR calc Af Amer: >60 mL/min (ref >60)
GFR calc non Af Amer: >60 mL/min (ref >60)
GLUCOSE: 113 mg/dL — AB (ref 65–99)
Potassium: 4.1 mmol/L (ref 3.5–5.1)
SODIUM: 143 mmol/L (ref 135–145)
Total Bilirubin: 0.7 mg/dL (ref 0.3–1.2)
Total Protein: 7.5 g/dL (ref 6.5–8.1)

## 2015-11-29 LAB — URINALYSIS COMPLETE WITH MICROSCOPIC (ARMC ONLY)
BACTERIA UA: NONE SEEN
Bilirubin Urine: NEGATIVE
Glucose, UA: NEGATIVE mg/dL
Hgb urine dipstick: NEGATIVE
Ketones, ur: NEGATIVE mg/dL
Leukocytes, UA: NEGATIVE
Nitrite: NEGATIVE
PH: 6 (ref 5.0–8.0)
PROTEIN: NEGATIVE mg/dL
RBC / HPF: NONE SEEN RBC/hpf (ref 0–5)
SPECIFIC GRAVITY, URINE: 1.002 — AB (ref 1.005–1.030)
Squamous Epithelial / LPF: NONE SEEN
WBC UA: NONE SEEN WBC/hpf (ref 0–5)

## 2015-11-29 MED ORDER — LORAZEPAM 0.5 MG PO TABS
0.5000 mg | ORAL_TABLET | Freq: Once | ORAL | Status: AC
  Administered 2015-11-29: 0.5 mg via ORAL
  Filled 2015-11-29: qty 1

## 2015-11-29 MED ORDER — KETOROLAC TROMETHAMINE 30 MG/ML IJ SOLN
30.0000 mg | Freq: Once | INTRAMUSCULAR | Status: AC
  Administered 2015-11-29: 30 mg via INTRAVENOUS
  Filled 2015-11-29: qty 1

## 2015-11-29 MED ORDER — IOHEXOL 300 MG/ML  SOLN
100.0000 mL | Freq: Once | INTRAMUSCULAR | Status: AC | PRN
  Administered 2015-11-29: 100 mL via INTRAVENOUS

## 2015-11-29 MED ORDER — IOHEXOL 240 MG/ML SOLN
25.0000 mL | Freq: Once | INTRAMUSCULAR | Status: AC | PRN
  Administered 2015-11-29: 25 mL via ORAL

## 2015-11-29 MED ORDER — TRAMADOL HCL 50 MG PO TABS
50.0000 mg | ORAL_TABLET | Freq: Once | ORAL | Status: AC
  Administered 2015-11-29: 50 mg via ORAL
  Filled 2015-11-29: qty 1

## 2015-11-29 NOTE — Discharge Instructions (Signed)
Abdominal Pain, Adult Many things can cause abdominal pain. Usually, abdominal pain is not caused by a disease and will improve without treatment. It can often be observed and treated at home. Your health care provider will do a physical exam and possibly order blood tests and X-rays to help determine the seriousness of your pain. However, in many cases, more time must pass before a clear cause of the pain can be found. Before that point, your health care provider may not know if you need more testing or further treatment. HOME CARE INSTRUCTIONS Monitor your abdominal pain for any changes. The following actions may help to alleviate any discomfort you are experiencing:  Only take over-the-counter or prescription medicines as directed by your health care provider.  Do not take laxatives unless directed to do so by your health care provider.  Try a clear liquid diet (broth, tea, or water) as directed by your health care provider. Slowly move to a bland diet as tolerated. SEEK MEDICAL CARE IF:  You have unexplained abdominal pain.  You have abdominal pain associated with nausea or diarrhea.  You have pain when you urinate or have a bowel movement.  You experience abdominal pain that wakes you in the night.  You have abdominal pain that is worsened or improved by eating food.  You have abdominal pain that is worsened with eating fatty foods.  You have a fever. SEEK IMMEDIATE MEDICAL CARE IF:  Your pain does not go away within 2 hours.  You keep throwing up (vomiting).  Your pain is felt only in portions of the abdomen, such as the right side or the left lower portion of the abdomen.  You pass bloody or black tarry stools. MAKE SURE YOU:  Understand these instructions.  Will watch your condition.  Will get help right away if you are not doing well or get worse.   This information is not intended to replace advice given to you by your health care provider. Make sure you discuss  any questions you have with your health care provider.   Document Released: 08/27/2005 Document Revised: 08/08/2015 Document Reviewed: 07/27/2013 Elsevier Interactive Patient Education 2016 [employer]  Inguinal Hernia, Adult Muscles help keep everything in the body in its proper place. But if a weak spot in the muscles develops, something can poke through. That is called a hernia. When this happens in the lower part of the belly (abdomen), it is called an inguinal hernia. (It takes its name from a part of the body in this region called the inguinal canal.) A weak spot in the wall of muscles lets some fat or part of the small intestine bulge through. An inguinal hernia can develop at any age. Men get them more often than women. CAUSES  In adults, an inguinal hernia develops over time.  It can be triggered by:  Suddenly straining the muscles of the lower abdomen.  Lifting heavy objects.  Straining to have a bowel movement. Difficult bowel movements (constipation) can lead to this.  Constant coughing. This may be caused by smoking or lung disease.  Being overweight.  Being pregnant.  Working at a job that requires long periods of standing or heavy lifting.  Having had an inguinal hernia before. One type can be an emergency situation. It is called a strangulated inguinal hernia. It develops if part of the small intestine slips through the weak spot and cannot get back into the abdomen. The blood supply can be cut off. If that happens, part  of the intestine may die. This situation requires emergency surgery. SYMPTOMS  Often, a small inguinal hernia has no symptoms. It is found when a healthcare provider does a physical exam. Larger hernias usually have symptoms.   In adults, symptoms may include:  A lump in the groin. This is easier to see when the person is standing. It might disappear when lying down.  In men, a lump in the scrotum.  Pain or burning in the groin. This occurs  especially when lifting, straining or coughing.  A dull ache or feeling of pressure in the groin.  Signs of a strangulated hernia can include:  A bulge in the groin that becomes very painful and tender to the touch.  A bulge that turns red or purple.  Fever, nausea and vomiting.  Inability to have a bowel movement or to pass gas. DIAGNOSIS  To decide if you have an inguinal hernia, a healthcare provider will probably do a physical examination.  This will include asking questions about any symptoms you have noticed.  The healthcare provider might feel the groin area and ask you to cough. If an inguinal hernia is felt, the healthcare provider may try to slide it back into the abdomen.  Usually no other tests are needed. TREATMENT  Treatments can vary. The size of the hernia makes a difference. Options include:  Watchful waiting. This is often suggested if the hernia is small and you have had no symptoms.  No medical procedure will be done unless symptoms develop.  You will need to watch closely for symptoms. If any occur, contact your healthcare provider right away.  Surgery. This is used if the hernia is larger or you have symptoms.  Open surgery. This is usually an outpatient procedure (you will not stay overnight in a hospital). An cut (incision) is made through the skin in the groin. The hernia is put back inside the abdomen. The weak area in the muscles is then repaired by herniorrhaphy or hernioplasty. Herniorrhaphy: in this type of surgery, the weak muscles are sewn back together. Hernioplasty: a patch or mesh is used to close the weak area in the abdominal wall.  Laparoscopy. In this procedure, a surgeon makes small incisions. A thin tube with a tiny video camera (called a laparoscope) is put into the abdomen. The surgeon repairs the hernia with mesh by looking with the video camera and using two long instruments. HOME CARE INSTRUCTIONS   After surgery to repair an inguinal  hernia:  You will need to take pain medicine prescribed by your healthcare provider. Follow all directions carefully.  You will need to take care of the wound from the incision.  Your activity will be restricted for awhile. This will probably include no heavy lifting for several weeks. You also should not do anything too active for a few weeks. When you can return to work will depend on the type of job that you have.  During "watchful waiting" periods, you should:  Maintain a healthy weight.  Eat a diet high in fiber (fruits, vegetables and whole grains).  Drink plenty of fluids to avoid constipation. This means drinking enough water and other liquids to keep your urine clear or pale yellow.  Do not lift heavy objects.  Do not stand for long periods of time.  Quit smoking. This should keep you from developing a frequent cough. SEEK MEDICAL CARE IF:   A bulge develops in your groin area.  You feel pain, a burning sensation or pressure  in the groin. This might be worse if you are lifting or straining.  You develop a fever of more than 100.5 F (38.1 C). SEEK IMMEDIATE MEDICAL CARE IF:   Pain in the groin increases suddenly.  A bulge in the groin gets bigger suddenly and does not go down.  For men, there is sudden pain in the scrotum. Or, the size of the scrotum increases.  A bulge in the groin area becomes red or purple and is painful to touch.  You have nausea or vomiting that does not go away.  You feel your heart beating much faster than normal.  You cannot have a bowel movement or pass gas.  You develop a fever of more than 102.0 F (38.9 C).   This information is not intended to replace advice given to you by your health care provider. Make sure you discuss any questions you have with your health care provider.   Document Released: 04/05/2009 Document Revised: 02/09/2012 Document Reviewed: 05/21/2015 Elsevier Interactive Patient Education [employer]2016 Elsevier Inc.

## 2015-11-29 NOTE — ED Notes (Addendum)
Pt states he has a left groin hernia that he is having surgery on next month. States since 7:30p or 8p, he was running a fever and hernia popped out and he couldn't get it back in. States "it feels twisted". He states he was "having abdominal pain from one side to the other and trouble breathing". Pt states he was smoking earlier tonight.   Dr. Zenda AlpersWebster at bedside.

## 2015-11-29 NOTE — ED Provider Notes (Signed)
[facility] Emergency Department Provider Note  ____________________________________________  Time seen: Approximately 242 AM  I have reviewed the triage vital signs and the nursing notes.   HISTORY  Chief Complaint Hernia    HPI Jeffrey Wilkins is a 49 y.o. male who comes into the hospital with a left-sided hernia. The patient reports that earlier today he was running a fever and he can typically reduce his hernia. He reports that today he was having some pain after he attempted to reduce his hernia. He reports that he is supposed to be having surgery next month but when he reduce his hernia he thinks his intestines may have twisted. The patient started having some pain from the left abdomen across to the right. He reports that he also had some trouble breathing. The symptoms started around 6:30 PM to 7 PM. The patient reports that the swelling in his left groin has subsided but he still has pain. The patient had initially been placed in sub wait and he has been going outside to smoke. The patient reports that his hernia trouble he thinks has caused his trouble breathing. He rates his pain as an 8 out of 10 in intensity. The patient was supposed to have a surgery consult on December 27 but he missed his appointment.   Past Medical History  Diagnosis Date  . Pancreatitis   . Inguinal hernia     There are no active problems to display for this patient.   No past surgical history on file.  Current Outpatient Rx  Name  Route  Sig  Dispense  Refill  . ibuprofen (ADVIL,MOTRIN) 200 MG tablet   Oral   Take 400 mg by mouth every 6 (six) hours as needed for moderate pain.         [name]. ketorolac (TORADOL) 10 MG tablet   Oral   Take 1 tablet (10 mg total) by mouth every 8 (eight) hours as needed.   20 tablet   0   . Multiple Vitamin (MULTIVITAMIN WITH MINERALS) TABS tablet   Oral   Take 1 tablet by mouth daily.         [name]. oxyCODONE-acetaminophen (ROXICET)  5-325 MG per tablet   Oral   Take 1 tablet by mouth every 4 (four) hours as needed for severe pain.   10 tablet   0     Allergies Review of patient's allergies indicates no known allergies.  No family history on file.  Social History Social History  Substance Use Topics  . Smoking status: Current Every Day Smoker -- 1.00 packs/day    Types: Cigarettes  . Smokeless tobacco: Never Used  . Alcohol Use: Yes     Comment: 6 pack a week    Review of Systems Constitutional: No fever/chills Eyes: No visual changes. ENT: No sore throat. Cardiovascular: Denies chest pain. Respiratory:  shortness of breath. Gastrointestinal: abdominal pain.  No nausea, no vomiting.  No diarrhea.  No constipation. Genitourinary: Negative for dysuria. Musculoskeletal: Negative for back pain. Skin: Negative for rash. Neurological: Negative for headaches, focal weakness or numbness.  10-point ROS otherwise negative.  ____________________________________________   PHYSICAL EXAM:  VITAL SIGNS: ED Triage Vitals  Enc Vitals Group     BP 11/29/15 0024 146/83 mmHg     Pulse Rate 11/29/15 0024 89     Resp 11/29/15 0024 20     Temp 11/29/15 0024 97.7 F (36.5 C)     Temp Source 11/29/15 0024 Oral  SpO2 11/29/15 0024 99 %     Weight 11/29/15 0024 145 lb (65.772 kg)     Height 11/29/15 0024  (1.727 m)     Head Cir --      Peak Flow --      Pain Score 11/29/15 0024 7     Pain Loc --      Pain Edu? --      Excl. in GC? --     Constitutional: Alert and oriented. Well appearing and in mild distress. Eyes: Conjunctivae are normal. PERRL. EOMI. Head: Atraumatic. Nose: No congestion/rhinnorhea. Mouth/Throat: Mucous membranes are moist.  Oropharynx non-erythematous. Cardiovascular: Normal rate, regular rhythm. Grossly normal heart sounds.  Good peripheral circulation. Respiratory: Normal respiratory effort.  No retractions. Lungs CTAB. Gastrointestinal: Soft with left-sided tenderness to  palpation. No distention. Positive bowel sounds Genitourinary: No testicular bulging or swelling. Patient does have some mild tenderness to palpation over his left inguinal area although he has no palpable masses Musculoskeletal: No lower extremity tenderness nor edema.   Neurologic:  Normal speech and language.  Skin:  Skin is warm, dry and intact.  Psychiatric: Mood and affect are normal.   ____________________________________________   LABS (all labs ordered are listed, but only abnormal results are displayed)  Labs Reviewed  CBC WITH DIFFERENTIAL/PLATELET - Abnormal; Notable for the following:    MCH 34.2 (*)    Lymphs Abs 4.3 (*)    All other components within normal limits  COMPREHENSIVE METABOLIC PANEL - Abnormal; Notable for the following:    Glucose, Bld 113 (*)    All other components within normal limits  URINALYSIS COMPLETEWITH MICROSCOPIC ([facility] ONLY) - Abnormal; Notable for the following:    Color, Urine COLORLESS (*)    APPearance CLEAR (*)    Specific Gravity, Urine 1.002 (*)    All other components within normal limits   ____________________________________________  EKG  None ____________________________________________  RADIOLOGY  CT abdomen and pelvis: No acute abnormality seen within the abdomen or pelvis, no inguinal hernia seen, slight thickening of the soft tissues about the left inguinal canal is nonspecific. Previous noted trace fluid and inflammation at the inguinal region has resolved.  ____________________________________________   PROCEDURES  Procedure(s) performed: None  Critical Care performed: No  ____________________________________________   INITIAL IMPRESSION / ASSESSMENT AND PLAN / ED COURSE  Pertinent labs & imaging results that were available during my care of the patient were reviewed by me and considered in my medical decision making (see chart for details).  This is a 49 year old male who comes into the hospital today with  left-sided groin pain and abdominal pain. The patient reports that this is due to his hernia. He is also having some mild shortness of breath. I offered to give the patient a chest x-ray to evaluate his shortness of breath but he reports he doesn't think that the pigtail. I will give the patient some Toradol and tramadol for his pain as he does not have a hernia noted on the CT scan. I did receive a call from radiology reporting that he did not see a hernia on the study. I will reassess the patient after the medication and discharge him to follow-up with surgery. ____________________________________________   FINAL CLINICAL IMPRESSION(S) / ED DIAGNOSES  Final diagnoses:  Left lower quadrant pain  Unilateral recurrent inguinal hernia without obstruction or gangrene      Rebecka Apley, MD 11/29/15 [phone]

## 2015-11-29 NOTE — ED Notes (Addendum)
Pt brought in via ems from home with a hernia in left groin.  Pt reports increased pain and states i can't push it back in and i have pain across my belly.  No n/v/d.  Sx since 1930 tonight.

## 2016-05-03 DIAGNOSIS — R45851 Suicidal ideations: Secondary | ICD-10-CM | POA: Insufficient documentation

## 2016-05-03 DIAGNOSIS — F17210 Nicotine dependence, cigarettes, uncomplicated: Secondary | ICD-10-CM | POA: Insufficient documentation

## 2016-05-03 DIAGNOSIS — F1290 Cannabis use, unspecified, uncomplicated: Secondary | ICD-10-CM | POA: Insufficient documentation

## 2016-05-04 ENCOUNTER — Encounter: Payer: Self-pay | Admitting: Emergency Medicine

## 2016-05-04 ENCOUNTER — Emergency Department
Admission: EM | Admit: 2016-05-04 | Discharge: 2016-05-04 | Disposition: A | Discharge status: Home / Self Care | Payer: Self-pay | Attending: Emergency Medicine | Admitting: Emergency Medicine

## 2016-05-04 DIAGNOSIS — F17210 Nicotine dependence, cigarettes, uncomplicated: Secondary | ICD-10-CM | POA: Insufficient documentation

## 2016-05-04 DIAGNOSIS — Z046 Encounter for general psychiatric examination, requested by authority: Secondary | ICD-10-CM

## 2016-05-04 DIAGNOSIS — F1010 Alcohol abuse, uncomplicated: Secondary | ICD-10-CM

## 2016-05-04 DIAGNOSIS — R45851 Suicidal ideations: Secondary | ICD-10-CM

## 2016-05-04 DIAGNOSIS — F4325 Adjustment disorder with mixed disturbance of emotions and conduct: Secondary | ICD-10-CM

## 2016-05-04 DIAGNOSIS — F1210 Cannabis abuse, uncomplicated: Secondary | ICD-10-CM | POA: Insufficient documentation

## 2016-05-04 DIAGNOSIS — F10220 Alcohol dependence with intoxication, uncomplicated: Secondary | ICD-10-CM | POA: Insufficient documentation

## 2016-05-04 DIAGNOSIS — F329 Major depressive disorder, single episode, unspecified: Secondary | ICD-10-CM | POA: Insufficient documentation

## 2016-05-04 LAB — CBC
HCT: 46 % (ref 40.0–52.0)
HEMATOCRIT: 46.9 % (ref 40.0–52.0)
HEMOGLOBIN: 16 g/dL (ref 13.0–18.0)
Hemoglobin: 16.5 g/dL (ref 13.0–18.0)
MCH: 34.2 pg — ABNORMAL HIGH (ref 26.0–34.0)
MCH: 34.4 pg — ABNORMAL HIGH (ref 26.0–34.0)
MCHC: 34.8 g/dL (ref 32.0–36.0)
MCHC: 35.2 g/dL (ref 32.0–36.0)
MCV: 98.3 fL (ref 80.0–100.0)
MCV: 97.5 fL (ref 80.0–100.0)
PLATELETS: 272 10*3/uL (ref 150–440)
Platelets: 251 10*3/uL (ref 150–440)
RBC: 4.68 MIL/uL (ref 4.40–5.90)
RBC: 4.81 MIL/uL (ref 4.40–5.90)
RDW: 13.3 % (ref 11.5–14.5)
RDW: 13.5 % (ref 11.5–14.5)
WBC: 9.8 10*3/uL (ref 3.8–10.6)
WBC: 11 10*3/uL — AB (ref 3.8–10.6)

## 2016-05-04 LAB — COMPREHENSIVE METABOLIC PANEL
ALBUMIN: 4.4 g/dL (ref 3.5–5.0)
ALK PHOS: 82 U/L (ref 38–126)
ALT: 20 U/L (ref 17–63)
ALT: 21 U/L (ref 17–63)
ANION GAP: 10 (ref 5–15)
AST: 27 U/L (ref 15–41)
AST: 28 U/L (ref 15–41)
Albumin: 4.4 g/dL (ref 3.5–5.0)
Alkaline Phosphatase: 85 U/L (ref 38–126)
Anion gap: 9 (ref 5–15)
BUN: 6 mg/dL (ref 6–20)
BUN: 10 mg/dL (ref 6–20)
CALCIUM: 9 mg/dL (ref 8.9–10.3)
CHLORIDE: 106 mmol/L (ref 101–111)
CHLORIDE: 108 mmol/L (ref 101–111)
CO2: 23 mmol/L (ref 22–32)
CO2: 23 mmol/L (ref 22–32)
Calcium: 9.4 mg/dL (ref 8.9–10.3)
Creatinine, Ser: 0.81 mg/dL (ref 0.61–1.24)
Creatinine, Ser: 0.92 mg/dL (ref 0.61–1.24)
GFR calc Af Amer: >60 mL/min (ref >60)
GFR calc non Af Amer: >60 mL/min (ref >60)
GFR calc non Af Amer: >60 mL/min (ref >60)
GLUCOSE: 96 mg/dL (ref 65–99)
Glucose, Bld: 120 mg/dL — ABNORMAL HIGH (ref 65–99)
POTASSIUM: 3.8 mmol/L (ref 3.5–5.1)
Potassium: 3.8 mmol/L (ref 3.5–5.1)
SODIUM: 139 mmol/L (ref 135–145)
SODIUM: 140 mmol/L (ref 135–145)
Total Bilirubin: 0.5 mg/dL (ref 0.3–1.2)
Total Bilirubin: 0.7 mg/dL (ref 0.3–1.2)
Total Protein: 7.3 g/dL (ref 6.5–8.1)
Total Protein: 7.3 g/dL (ref 6.5–8.1)

## 2016-05-04 LAB — URINE DRUG SCREEN, QUALITATIVE (ARMC ONLY)
AMPHETAMINES, UR SCREEN: NOT DETECTED
Amphetamines, Ur Screen: NOT DETECTED
BARBITURATES, UR SCREEN: NOT DETECTED
BENZODIAZEPINE, UR SCRN: NOT DETECTED
BENZODIAZEPINE, UR SCRN: NOT DETECTED
Barbiturates, Ur Screen: NOT DETECTED
COCAINE METABOLITE, UR ~~LOC~~: NOT DETECTED
Cannabinoid 50 Ng, Ur ~~LOC~~: POSITIVE — AB
Cannabinoid 50 Ng, Ur ~~LOC~~: POSITIVE — AB
Cocaine Metabolite,Ur ~~LOC~~: NOT DETECTED
MDMA (ECSTASY) UR SCREEN: NOT DETECTED
MDMA (Ecstasy)Ur Screen: NOT DETECTED
METHADONE SCREEN, URINE: NOT DETECTED
METHADONE SCREEN, URINE: NOT DETECTED
OPIATE, UR SCREEN: NOT DETECTED
Opiate, Ur Screen: NOT DETECTED
PHENCYCLIDINE (PCP) UR S: NOT DETECTED
Phencyclidine (PCP) Ur S: NOT DETECTED
TRICYCLIC, UR SCREEN: NOT DETECTED
Tricyclic, Ur Screen: NOT DETECTED

## 2016-05-04 LAB — ETHANOL
ALCOHOL ETHYL (B): 253 mg/dL — AB (ref <5)
Alcohol, Ethyl (B): 170 mg/dL — ABNORMAL HIGH (ref <5)

## 2016-05-04 LAB — ACETAMINOPHEN LEVEL

## 2016-05-04 LAB — SALICYLATE LEVEL: Salicylate Lvl: <4 mg/dL (ref 2.8–30.0)

## 2016-05-04 MED ORDER — ALPRAZOLAM 0.5 MG PO TABS
1.0000 mg | ORAL_TABLET | Freq: Once | ORAL | Status: AC
  Administered 2016-05-04: 1 mg via ORAL
  Filled 2016-05-04: qty 2

## 2016-05-04 MED ORDER — ACETAMINOPHEN 500 MG PO TABS
1000.0000 mg | ORAL_TABLET | Freq: Once | ORAL | Status: AC
  Administered 2016-05-04: 1000 mg via ORAL
  Filled 2016-05-04: qty 2

## 2016-05-04 MED ORDER — NICOTINE 21 MG/24HR TD PT24
MEDICATED_PATCH | TRANSDERMAL | Status: AC
  Administered 2016-05-04: 21 mg via TRANSDERMAL
  Filled 2016-05-04: qty 1

## 2016-05-04 MED ORDER — NICOTINE 21 MG/24HR TD PT24
21.0000 mg | MEDICATED_PATCH | Freq: Once | TRANSDERMAL | Status: DC
  Administered 2016-05-04: 21 mg via TRANSDERMAL

## 2016-05-04 NOTE — ED Notes (Signed)
[place] via [place] [employer], IVC for SI with attempted overdose.  Pt reports unknown quantity of "some shit that starts with a Z" pills taken.    Per police these pills belonged to pt's girlfriend.  Police called to [facility] house d/t 911 call of the GF wanting the pt "gone because he (the pt) was only supposed to be here a couple of days".  Attempted to contact Tobey BrideGF, Rebecca Flynt, [phone], of [address],  [place], without success.    Pt reports  SI tonight with prior attempt with a gun 5 years ago.  Pt is openly hostile and verbally abusive, indirect threat of violence if not allowed to leave, Dr Zenda AlpersWebster and security notified.  Reports 4 x 40oz beer and marijuana use.

## 2016-05-04 NOTE — ED Notes (Signed)
Pt says he was here last night with suicidal thoughts and depression; was in the ED all night and discharged this am; pt returns tonight, brought in by [place] [employer], for continued suicidal thoughts; pt says he had a gun and was going to shoot himself but called 911 instead; pt admits to drinking 1-40oz beer; denies drugs or pills tonight; pt calm and cooperative in triage; here voluntarily

## 2016-05-04 NOTE — ED Notes (Signed)
Patient with discharge orders, no evidence of distress, patient's belongings given to Patient and He is going to lobby and states "I will find a ride" Patient v/s stable.

## 2016-05-04 NOTE — Discharge Instructions (Signed)
Stress and Stress Management °Stress is a normal reaction to life events. It is what you feel when life demands more than you are used to or more than you can handle. Some stress can be useful. For example, the stress reaction can help you catch the last bus of the day, study for a test, or meet a deadline at work. But stress that occurs too often or for too long can cause problems. It can affect your emotional health and interfere with relationships and normal daily activities. Too much stress can weaken your immune system and increase your risk for physical illness. If you already have a medical problem, stress can make it worse. °CAUSES  °All sorts of life events may cause stress. An event that causes stress for one person may not be stressful for another person. Major life events commonly cause stress. These may be positive or negative. Examples include losing your job, moving into a new home, getting married, having a baby, or losing a loved one. Less obvious life events may also cause stress, especially if they occur day after day or in combination. Examples include working long hours, driving in traffic, caring for children, being in debt, or being in a difficult relationship. °SIGNS AND SYMPTOMS °Stress may cause emotional symptoms including, the following: °· Anxiety. This is feeling worried, afraid, on edge, overwhelmed, or out of control. °· Anger. This is feeling irritated or impatient. °· Depression. This is feeling sad, down, helpless, or guilty. °· Difficulty focusing, remembering, or making decisions. °Stress may cause physical symptoms, including the following:  °· Aches and pains. These may affect your head, neck, back, stomach, or other areas of your body. °· Tight muscles or clenched jaw. °· Low energy or trouble sleeping.  °Stress may cause unhealthy behaviors, including the following:  °· Eating to feel better (overeating) or skipping meals. °· Sleeping too little, too much, or both. °· Working  too much or putting off tasks (procrastination). °· Smoking, drinking alcohol, or using drugs to feel better. °DIAGNOSIS  °Stress is diagnosed through an assessment by your health care provider. Your health care provider will ask questions about your symptoms and any stressful life events. Your health care provider will also ask about your medical history and may order blood tests or other tests. Certain medical conditions and medicine can cause physical symptoms similar to stress.  Mental illness can cause emotional symptoms and unhealthy behaviors similar to stress. Your health care provider may refer you to a mental health professional for further evaluation.  °TREATMENT  °Stress management is the recommended treatment for stress. The goals of stress management are reducing stressful life events and coping with stress in healthy ways.  °Techniques for reducing stressful life events include the following: °· Stress identification. Self-monitor for stress and identify what causes stress for you. These skills may help you to avoid some stressful events. °· Time management. Set your priorities, keep a calendar of events, and learn to say "no." These tools can help you avoid making too many commitments. °Techniques for coping with stress include the following: °· Rethinking the problem. Try to think realistically about stressful events rather than ignoring them or overreacting. Try to find the positives in a stressful situation rather than focusing on the negatives. °· Exercise. Physical exercise can release both physical and emotional tension. The key is to find a form of exercise you enjoy and do it regularly. °· Relaxation techniques. These relax the body and mind. Examples include yoga, meditation, tai chi, biofeedback, deep   breathing, progressive muscle relaxation, listening to music, being out in nature, journaling, and other hobbies. Again, the key is to find one or more that you enjoy and can do  regularly.  Healthy lifestyle. Eat a balanced diet, get plenty of sleep, and do not smoke. Avoid using alcohol or drugs to relax.  Strong support network. Spend time with family, friends, or other people you enjoy being around.Express your feelings and talk things over with someone you trust. Counseling or talktherapy with a mental health professional may be helpful if you are having difficulty managing stress on your own. Medicine is typically not recommended for the treatment of stress.Talk to your health care provider if you think you need medicine for symptoms of stress. HOME CARE INSTRUCTIONS  Keep all follow-up visits as directed by your health care provider.  Take all medicines as directed by your health care provider. SEEK MEDICAL CARE IF:  Your symptoms get worse or you start having new symptoms.  You feel overwhelmed by your problems and can no longer manage them on your own. SEEK IMMEDIATE MEDICAL CARE IF:  You feel like hurting yourself or someone else.   This information is not intended to replace advice given to you by your health care provider. Make sure you discuss any questions you have with your health care provider.   Document Released: 05/13/2001 Document Revised: 12/08/2014 Document Reviewed: 07/12/2013 Elsevier Interactive Patient Education 2016 [employer].  Suicidal Feelings: How to Help Yourself Suicide is the taking of one's own life. If you feel as though life is getting too tough to handle and are thinking about suicide, get help right away. To get help:  Call your local emergency services (911 in the [place]  Call a suicide hotline to speak with a trained counselor who understands how you are feeling. The following is a list of suicide hotlines in the [place]. For a list of hotlines in [place], visit FindSkins.pl.  1-800-273-TALK 938-626-5934).  1-800-SUICIDE  671-217-0062).  (603) 120-4685. This is a hotline for Spanish speakers.  0-786-754-4BEE (856) 388-4282). This is a hotline for TTY users.  1-866-4-U-TREVOR 251 077 2158). This is a hotline for lesbian, gay, bisexual, transgender, or questioning youth.  Contact a crisis center or a local suicide prevention center. To find a crisis center or suicide prevention center:  Call your local hospital, clinic, community service organization, mental health center, social service provider, or health department. Ask for assistance in connecting to a crisis center.  Visit BankingRep.com.au for a list of crisis centers in the [place], or visit www.suicideprevention.ca/thinking-about-suicide/find-a-crisis-centre for a list of centers in [place].  Visit the following websites:  National Suicide Prevention Lifeline: www.suicidepreventionlifeline.org  Hopeline: www.hopeline.[place] for Suicide Prevention: PromotionalLoans.co.za  The [employer] (for lesbian, gay, bisexual, transgender, or questioning youth): www.thetrevorproject.org HOW CAN I HELP MYSELF FEEL BETTER?  Promise yourself that you will not do anything drastic when you have suicidal feelings. Remember, there is hope. Many people have gotten through suicidal thoughts and feelings, and you will, too. You may have gotten through them before, and this proves that you can get through them again.  Let family, friends, teachers, or counselors know how you are feeling. Try not to isolate yourself from those who care about you. Remember, they will want to help you. Talk with someone every day, even if you do not feel sociable. Face-to-face conversation is best.  Call a mental health professional and see one regularly.  Visit your primary health care provider every year.  Eat a well-balanced diet, and space your meals so you eat regularly.  Get plenty of rest.  Avoid alcohol and  drugs, and remove them from your home. They will only make you feel worse.  If you are thinking of taking a lot of medicine, give your medicine to someone who can give it to you one day at a time. If you are on antidepressants and are concerned you will overdose, let your health care provider know so he or she can give you safer medicines. Ask your mental health professional about the possible side effects of any medicines you are taking.  Remove weapons, poisons, knives, and anything else that could harm you from your home.  Try to stick to routines. Follow a schedule every day. Put self-care on your schedule.  Make a list of realistic goals, and cross them off when you achieve them. Accomplishments give a sense of worth.  Wait until you are feeling better before doing the things you find difficult or unpleasant.  Exercise if you are able. You will feel better if you exercise for even a half hour each day.  Go out in the sun or into nature. This will help you recover from depression faster. If you have a favorite place to walk, go there.  Do the things that have always given you pleasure. Play your favorite music, read a good book, paint a picture, play your favorite instrument, or do anything else that takes your mind off your depression if it is safe to do.  Keep your living space well lit.  When you are feeling well, write yourself a letter about tips and support that you can read when you are not feeling well.  Remember that life's difficulties can be sorted out with help. Conditions can be treated. You can work on thoughts and strategies that serve you well.   This information is not intended to replace advice given to you by your health care provider. Make sure you discuss any questions you have with your health care provider.   Document Released: 05/24/2003 Document Revised: 12/08/2014 Document Reviewed: 03/14/2014 Elsevier Interactive Patient Education [employer].

## 2016-05-04 NOTE — ED Notes (Signed)
Jeffrey Wilkins, "girlfriend" reports that she is missing no meds.  Pt has made empty threats before about pill ingestion and pt is homeless now.

## 2016-05-04 NOTE — ED Notes (Signed)
Pt stated to NT, Bri, I'm having a panic attack.  Pt stated to this RN "I'm ok, it'll pass", pt tearful, states to this RN "none of it matters"
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
## 2016-05-04 NOTE — ED Provider Notes (Signed)
Patient's been cleared for discharge by psychiatry. He remains medically stable.  Emily FilbertJonathan E Conway Fedora, MD 05/04/16 [phone]
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
## 2016-05-04 NOTE — ED Notes (Signed)
Patient with discharge orders, Patient states "i  Can't leave I have no where to go" nurse ask him if He could call some one and He states "No" Nurse told him that he does not meet criteria to stay inpatient, Patient became upset, He did have a place to go, but needed a ride, nurse attempted to check on bus ticket, but bus not running today, Patient aware that He may have to catch a ride from someone or walk. Patient without any physical disabilities, and Patient is oriented. No Si/Hi.

## 2016-05-04 NOTE — ED Notes (Signed)
Patient chief complaint of panic attacks. States GF "put him out" and he is now homeless.  When asked about SI,states "not like I was" and denies a plan.  Meal given.  Support and encouragement.  POC discussed and 154' checks con't for safety.

## 2016-05-05 ENCOUNTER — Emergency Department
Admission: EM | Admit: 2016-05-05 | Discharge: 2016-05-05 | Disposition: A | Discharge status: Home / Self Care | Payer: Self-pay | Attending: Emergency Medicine | Admitting: Emergency Medicine

## 2016-05-05 ENCOUNTER — Encounter: Payer: Self-pay | Admitting: Emergency Medicine

## 2016-05-05 DIAGNOSIS — F4325 Adjustment disorder with mixed disturbance of emotions and conduct: Secondary | ICD-10-CM | POA: Diagnosis present

## 2016-05-05 DIAGNOSIS — F329 Major depressive disorder, single episode, unspecified: Secondary | ICD-10-CM | POA: Insufficient documentation

## 2016-05-05 DIAGNOSIS — Z79899 Other long term (current) drug therapy: Secondary | ICD-10-CM | POA: Insufficient documentation

## 2016-05-05 DIAGNOSIS — F1210 Cannabis abuse, uncomplicated: Secondary | ICD-10-CM

## 2016-05-05 DIAGNOSIS — F17210 Nicotine dependence, cigarettes, uncomplicated: Secondary | ICD-10-CM | POA: Insufficient documentation

## 2016-05-05 DIAGNOSIS — F1219 Cannabis abuse with unspecified cannabis-induced disorder: Secondary | ICD-10-CM | POA: Insufficient documentation

## 2016-05-05 DIAGNOSIS — Z046 Encounter for general psychiatric examination, requested by authority: Secondary | ICD-10-CM

## 2016-05-05 DIAGNOSIS — F1010 Alcohol abuse, uncomplicated: Secondary | ICD-10-CM | POA: Diagnosis present

## 2016-05-05 DIAGNOSIS — F10920 Alcohol use, unspecified with intoxication, uncomplicated: Secondary | ICD-10-CM

## 2016-05-05 DIAGNOSIS — F1994 Other psychoactive substance use, unspecified with psychoactive substance-induced mood disorder: Secondary | ICD-10-CM

## 2016-05-05 DIAGNOSIS — F32A Depression, unspecified: Secondary | ICD-10-CM

## 2016-05-05 MED ORDER — NICOTINE 21 MG/24HR TD PT24
21.0000 mg | MEDICATED_PATCH | Freq: Once | TRANSDERMAL | Status: DC
  Administered 2016-05-05: 21 mg via TRANSDERMAL

## 2016-05-05 MED ORDER — HYDROXYZINE HCL 25 MG PO TABS
50.0000 mg | ORAL_TABLET | Freq: Once | ORAL | Status: AC
  Administered 2016-05-05: 50 mg via ORAL

## 2016-05-05 MED ORDER — NICOTINE 21 MG/24HR TD PT24
MEDICATED_PATCH | TRANSDERMAL | Status: AC
  Administered 2016-05-05: 21 mg via TRANSDERMAL
  Filled 2016-05-05: qty 1

## 2016-05-05 MED ORDER — DIPHENHYDRAMINE HCL 25 MG PO CAPS
25.0000 mg | ORAL_CAPSULE | Freq: Once | ORAL | Status: AC
  Administered 2016-05-05: 25 mg via ORAL
  Filled 2016-05-05: qty 1

## 2016-05-05 MED ORDER — HYDROXYZINE HCL 25 MG PO TABS
ORAL_TABLET | ORAL | Status: AC
  Filled 2016-05-05: qty 2

## 2016-05-05 NOTE — Discharge Instructions (Signed)
Alcohol Intoxication Alcohol intoxication occurs when the amount of alcohol that a person has consumed impairs his or her ability to mentally and physically function. Alcohol directly impairs the normal chemical activity of the brain. Drinking large amounts of alcohol can lead to changes in mental function and behavior, and it can cause many physical effects that can be harmful.  Alcohol intoxication can range in severity from mild to very severe. Various factors can affect the level of intoxication that occurs, such as the person's age, gender, weight, frequency of alcohol consumption, and the presence of other medical conditions (such as diabetes, seizures, or heart conditions). Dangerous levels of alcohol intoxication may occur when people drink large amounts of alcohol in a short period (binge drinking). Alcohol can also be especially dangerous when combined with certain prescription medicines or "recreational" drugs. SIGNS AND SYMPTOMS Some common signs and symptoms of mild alcohol intoxication include:  Loss of coordination.  Changes in mood and behavior.  Impaired judgment.  Slurred speech. As alcohol intoxication progresses to more severe levels, other signs and symptoms will appear. These may include:  Vomiting.  Confusion and impaired memory.  Slowed breathing.  Seizures.  Loss of consciousness. DIAGNOSIS  Your health care provider will take a medical history and perform a physical exam. You will be asked about the amount and type of alcohol you have consumed. Blood tests will be done to measure the concentration of alcohol in your blood. In many places, your blood alcohol level must be lower than 80 mg/dL (0.08%) to legally drive. However, many dangerous effects of alcohol can occur at much lower levels.  TREATMENT  People with alcohol intoxication often do not require treatment. Most of the effects of alcohol intoxication are temporary, and they go away as the alcohol naturally  leaves the body. Your health care provider will monitor your condition until you are stable enough to go home. Fluids are sometimes given through an IV access tube to help prevent dehydration.  HOME CARE INSTRUCTIONS  Do not drive after drinking alcohol.  Stay hydrated. Drink enough water and fluids to keep your urine clear or pale yellow. Avoid caffeine.   Only take over-the-counter or prescription medicines as directed by your health care provider.  SEEK MEDICAL CARE IF:   You have persistent vomiting.   You do not feel better after a few days.  You have frequent alcohol intoxication. Your health care provider can help determine if you should see a substance use treatment counselor. SEEK IMMEDIATE MEDICAL CARE IF:   You become shaky or tremble when you try to stop drinking.   You shake uncontrollably (seizure).   You throw up (vomit) blood. This may be bright red or may look like black coffee grounds.   You have blood in your stool. This may be bright red or may appear as a black, tarry, bad smelling stool.   You become lightheaded or faint.  MAKE SURE YOU:   Understand these instructions.  Will watch your condition.  Will get help right away if you are not doing well or get worse.   This information is not intended to replace advice given to you by your health care provider. Make sure you discuss any questions you have with your health care provider.   Document Released: 08/27/2005 Document Revised: 07/20/2013 Document Reviewed: 04/22/2013 Elsevier Interactive Patient Education 2016 [employer].  Alcohol Use Disorder Alcohol use disorder is a mental disorder. It is not a one-time incident of heavy drinking. Alcohol use  disorder is the excessive and uncontrollable use of alcohol over time that leads to problems with functioning in one or more areas of daily living. People with this disorder risk harming themselves and others when they drink to excess. Alcohol use  disorder also can cause other mental disorders, such as mood and anxiety disorders, and serious physical problems. People with alcohol use disorder often misuse other drugs.  Alcohol use disorder is common and widespread. Some people with this disorder drink alcohol to cope with or escape from negative life events. Others drink to relieve chronic pain or symptoms of mental illness. People with a family history of alcohol use disorder are at higher risk of losing control and using alcohol to excess.  Drinking too much alcohol can cause injury, accidents, and health problems. One drink can be too much when you are:  Working.  Pregnant or breastfeeding.  Taking medicines. Ask your doctor.  Driving or planning to drive. SYMPTOMS  Signs and symptoms of alcohol use disorder may include the following:   Consumption ofalcohol inlarger amounts or over a longer period of time than intended.  Multiple unsuccessful attempts to cutdown or control alcohol use.   A great deal of time spent obtaining alcohol, using alcohol, or recovering from the effects of alcohol (hangover).  A strong desire or urge to use alcohol (cravings).   Continued use of alcohol despite problems at work, school, or home because of alcohol use.   Continued use of alcohol despite problems in relationships because of alcohol use.  Continued use of alcohol in situations when it is physically hazardous, such as driving a car.  Continued use of alcohol despite awareness of a physical or psychological problem that is likely related to alcohol use. Physical problems related to alcohol use can involve the brain, heart, liver, stomach, and intestines. Psychological problems related to alcohol use include intoxication, depression, anxiety, psychosis, delirium, and dementia.   The need for increased amounts of alcohol to achieve the same desired effect, or a decreased effect from the consumption of the same amount of alcohol  (tolerance).  Withdrawal symptoms upon reducing or stopping alcohol use, or alcohol use to reduce or avoid withdrawal symptoms. Withdrawal symptoms include:  Racing heart.  Hand tremor.  Difficulty sleeping.  Nausea.  Vomiting.  Hallucinations.  Restlessness.  Seizures. DIAGNOSIS Alcohol use disorder is diagnosed through an assessment by your health care provider. Your health care provider may start by asking three or four questions to screen for excessive or problematic alcohol use. To confirm a diagnosis of alcohol use disorder, at least two symptoms must be present within a 62-month period. The severity of alcohol use disorder depends on the number of symptoms:  Mild--two or three.  Moderate--four or five.  Severe--six or more. Your health care provider may perform a physical exam or use results from lab tests to see if you have physical problems resulting from alcohol use. Your health care provider may refer you to a mental health professional for evaluation. TREATMENT  Some people with alcohol use disorder are able to reduce their alcohol use to low-risk levels. Some people with alcohol use disorder need to quit drinking alcohol. When necessary, mental health professionals with specialized training in substance use treatment can help. Your health care provider can help you decide how severe your alcohol use disorder is and what type of treatment you need. The following forms of treatment are available:   Detoxification. Detoxification involves the use of prescription medicines to prevent alcohol  withdrawal symptoms in the first week after quitting. This is important for people with a history of symptoms of withdrawal and for heavy drinkers who are likely to have withdrawal symptoms. Alcohol withdrawal can be dangerous and, in severe cases, cause death. Detoxification is usually provided in a hospital or in-patient substance use treatment facility.  Counseling or talk therapy.  Talk therapy is provided by substance use treatment counselors. It addresses the reasons people use alcohol and ways to keep them from drinking again. The goals of talk therapy are to help people with alcohol use disorder find healthy activities and ways to cope with life stress, to identify and avoid triggers for alcohol use, and to handle cravings, which can cause relapse.  Medicines.Different medicines can help treat alcohol use disorder through the following actions:  Decrease alcohol cravings.  Decrease the positive reward response felt from alcohol use.  Produce an uncomfortable physical reaction when alcohol is used (aversion therapy).  Support groups. Support groups are run by people who have quit drinking. They provide emotional support, advice, and guidance. These forms of treatment are often combined. Some people with alcohol use disorder benefit from intensive combination treatment provided by specialized substance use treatment centers. Both inpatient and outpatient treatment programs are available.   This information is not intended to replace advice given to you by your health care provider. Make sure you discuss any questions you have with your health care provider.   Document Released: 12/25/2004 Document Revised: 12/08/2014 Document Reviewed: 02/24/2013 Elsevier Interactive Patient Education 2016 [employer]  Cannabis Use Disorder Cannabis use disorder is a mental disorder. It is not one-time or occasional use of cannabis, more commonly known as marijuana. Cannabis use disorder is the continued, nonmedical use of cannabis that interferes with normal life activities or causes health problems. People with cannabis use disorder get a feeling of extreme pleasure and relaxation from cannabis use. This "high" is very rewarding and causes people to use over and over.  The mind-altering ingredient in cannabis is know as THC. THC can also interfere with motor coordination, memory,  judgment, and accurate sense of space and time. These effects can last for a few days after using cannabis. Regular heavy cannabis use can cause long-lasting problems with thinking and learning. In young people, these problems may be permanent. Cannabis sometimes causes severe anxiety, paranoia, or visual hallucinations. Man-made (synthetic) cannabis-like drugs, such as "spice" and "K2," cause the same effects as THC but are much stronger. Cannabis-like drugs can cause dangerously high blood pressure and heart rate.  Cannabis use disorder usually starts in the teenage years. It can trigger the development of schizophrenia. It is somewhat more common in men than women. People who have family members with the disorder or existing mental health issues such as depression and posttraumatic stress disorderare more likely to develop cannabis use disorder. People with cannabis use disorder are at higher risk for use of other drugs of abuse.  SIGNS AND SYMPTOMS Signs and symptoms of cannabis use disorder include:   Use of cannabis in larger amounts or over a longer period than intended.   Unsuccessful attempts to cut down or control cannabis use.   A lot of time spent obtaining, using, or recovering from the effects of cannabis.   A strong desire or urge to use cannabis (cravings).   Continued use of cannabis in spite of problems at work, school, or home because of use.   Continued use of cannabis in spite of relationship problems because  of use.  Giving up or cutting down on important life activities because of cannabis use.  Use of cannabis over and over even in situations when it is physically hazardous, such as when driving a car.   Continued use of cannabis in spite of a physical problem that is likely related to use. Physical problems can include:  Chronic cough.  Bronchitis.  Emphysema.  Throat and lung cancer.  Continued use of cannabis in spite of a mental problem that is likely  related to use. Mental problems can include:  Psychosis.  Anxiety.  Difficulty sleeping.  Need to use more and more cannabis to get the same effect, or lessened effect over time with use of the same amount (tolerance).  Having withdrawal symptoms when cannabis use is stopped, or using cannabis to reduce or avoid withdrawal symptoms. Withdrawal symptoms include:  Irritability or anger.  Anxiety or restlessness.  Difficulty sleeping.  Loss of appetite or weight.  Aches and pains.  Shakiness.  Sweating.  Chills. DIAGNOSIS Cannabis use disorder is diagnosed by your health care provider. You may be asked questions about your cannabis use and how it affects your life. A physical exam may be done. A drug screen may be done. You may be referred to a mental health professional. The diagnosis of cannabis use disorder requires at least two symptoms within 12 months. The type of cannabis use disorder you have depends on the number of symptoms you have. The type may be:  Mild. Two or three signs and symptoms.   Moderate. Four or five signs and symptoms.   Severe. Six or more signs and symptoms.  TREATMENT Treatment is usually provided by mental health professionals with training in substance use disorders. The following options are available:  Counseling or talk therapy. Talk therapy addresses the reasons you use cannabis. It also addresses ways to keep you from using again. The goals of talk therapy include:  Identifying and avoiding triggers for use.  Learning how to handle cravings.  Replacing use with healthy activities.  Support groups. Support groups provide emotional support, advice, and guidance.  Medicine. Medicine is used to treat mental health issues that trigger cannabis use or that result from it. HOME CARE INSTRUCTIONS  Take medicines only as directed by your health care provider.  Check with your health care provider before starting any new medicines.  Keep  all follow-up visits as directed by your health care provider. SEEK MEDICAL CARE IF:  You are not able to take your medicines as directed.  Your symptoms get worse. SEEK IMMEDIATE MEDICAL CARE IF: You have serious thoughts about hurting yourself or others. FOR MORE INFORMATION  National Institute on Drug Abuse: http://www.price-smith.com/  Substance Abuse and Mental Health Services Administration: SkateOasis.com.pt   This information is not intended to replace advice given to you by your health care provider. Make sure you discuss any questions you have with your health care provider.   Document Released: 11/14/2000 Document Revised: 12/08/2014 Document Reviewed: 11/30/2013 Elsevier Interactive Patient Education [employer]    Please follow-up with the rescue mission and Alcoholics Anonymous. Please return as needed.

## 2016-05-05 NOTE — ED Notes (Signed)
Pt getting dressed for discharge. Clothing searched. Maintained on 15 minute checks and observation by security camera for safety.
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
## 2016-05-05 NOTE — ED Notes (Signed)
Pt discharged to lobby with all of his belongings.

## 2016-05-05 NOTE — ED Notes (Signed)
Pt is unhappy with discharge, but has not been a behavioral issue. 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 Belongings will be returned to pt upon discharge.

## 2016-05-05 NOTE — ED Notes (Signed)
ED BHU PLACEMENT JUSTIFICATION Is the patient under IVC or is there intent for IVC: Yes.   Is the patient medically cleared: Yes.   Is there vacancy in the ED BHU: Yes.   Is the population mix appropriate for patient: Yes.   Is the patient awaiting placement in inpatient or outpatient setting: No. Has the patient had a psychiatric consult: No. Survey of unit performed for contraband, proper placement and condition of furniture, tampering with fixtures in bathroom, shower, and each patient room: Yes.  ; Findings: NA APPEARANCE/BEHAVIOR calm, cooperative and adequate rapport can be established NEURO ASSESSMENT Orientation: time, place and person Hallucinations: No.None noted (Hallucinations) Speech: Normal Gait: normal RESPIRATORY ASSESSMENT Normal expansion.  Clear to auscultation.  No rales, rhonchi, or wheezing. CARDIOVASCULAR ASSESSMENT regular rate and rhythm, S1, S2 normal, no murmur, click, rub or gallop GASTROINTESTINAL ASSESSMENT soft, nontender, BS WNL, no r/g EXTREMITIES normal strength, tone, and muscle mass PLAN OF CARE Provide calm/safe environment. Vital signs assessed twice daily. ED BHU Assessment once each 12-hour shift. Collaborate with intake RN daily or as condition indicates. Assure the ED provider has rounded once each shift. Provide and encourage hygiene. Provide redirection as needed. Assess for escalating behavior; address immediately and inform ED provider.  Assess family dynamic and appropriateness for visitation as needed: Yes.  ; If necessary, describe findings: NA Educate the patient/family about BHU procedures/visitation: Yes.  ; If necessary, describe findings: NA  

## 2016-05-05 NOTE — Discharge Instructions (Signed)
Please seek medical attention and help for any thoughts about wanting to harm herself, harm others, any concerning change in behavior, severe depression, inappropriate drug use or any other new or concerning symptoms. °Major Depressive Disorder °Major depressive disorder is a mental illness. It also may be called clinical depression or unipolar depression. Major depressive disorder usually causes feelings of sadness, hopelessness, or helplessness. Some people with this disorder do not feel particularly sad but lose interest in doing things they used to enjoy (anhedonia). Major depressive disorder also can cause physical symptoms. It can interfere with work, school, relationships, and other normal everyday activities. The disorder varies in severity but is longer lasting and more serious than the sadness we all feel from time to time in our lives. °Major depressive disorder often is triggered by stressful life events or major life changes. Examples of these triggers include divorce, loss of your job or home, a move, and the death of a family member or close friend. Sometimes this disorder occurs for no obvious reason at all. People who have family members with major depressive disorder or bipolar disorder are at higher risk for developing this disorder, with or without life stressors. Major depressive disorder can occur at any age. It may occur just once in your life (single episode major depressive disorder). It may occur multiple times (recurrent major depressive disorder). °SYMPTOMS °People with major depressive disorder have either anhedonia or depressed mood on nearly a daily basis for at least 2 weeks or longer. Symptoms of depressed mood include: °· Feelings of sadness (blue or down in the dumps) or emptiness. °· Feelings of hopelessness or helplessness. °· Tearfulness or episodes of crying (may be observed by others). °· Irritability (children and adolescents). °In addition to depressed mood or anhedonia or  both, people with this disorder have at least four of the following symptoms: °· Difficulty sleeping or sleeping too much.   °· Significant change (increase or decrease) in appetite or weight.   °· Lack of energy or motivation. °· Feelings of guilt and worthlessness.   °· Difficulty concentrating, remembering, or making decisions. °· Unusually slow movement (psychomotor retardation) or restlessness (as observed by others).   °· Recurrent wishes for death, recurrent thoughts of self-harm (suicide), or a suicide attempt. °People with major depressive disorder commonly have persistent negative thoughts about themselves, other people, and the world. People with severe major depressive disorder may experience distorted beliefs or perceptions about the world (psychotic delusions). They also may see or hear things that are not real (psychotic hallucinations). °DIAGNOSIS °Major depressive disorder is diagnosed through an assessment by your health care provider. Your health care provider will ask about aspects of your daily life, such as mood, sleep, and appetite, to see if you have the diagnostic symptoms of major depressive disorder. Your health care provider may ask about your medical history and use of alcohol or drugs, including prescription medicines. Your health care provider also may do a physical exam and blood work. This is because certain medical conditions and the use of certain substances can cause major depressive disorder-like symptoms (secondary depression). Your health care provider also may refer you to a mental health specialist for further evaluation and treatment. °TREATMENT °It is important to recognize the symptoms of major depressive disorder and seek treatment. The following treatments can be prescribed for this disorder:   °· Medicine. Antidepressant medicines usually are prescribed. Antidepressant medicines are thought to correct chemical imbalances in the brain that are commonly associated with  major depressive disorder. Other types of medicine may be   added if the symptoms do not respond to antidepressant medicines alone or if psychotic delusions or hallucinations occur. °· Talk therapy. Talk therapy can be helpful in treating major depressive disorder by providing support, education, and guidance. Certain types of talk therapy also can help with negative thinking (cognitive behavioral therapy) and with relationship issues that trigger this disorder (interpersonal therapy). °A mental health specialist can help determine which treatment is best for you. Most people with major depressive disorder do well with a combination of medicine and talk therapy. Treatments involving electrical stimulation of the brain can be used in situations with extremely severe symptoms or when medicine and talk therapy do not work over time. These treatments include electroconvulsive therapy, transcranial magnetic stimulation, and vagal nerve stimulation. °  °This information is not intended to replace advice given to you by your health care provider. Make sure you discuss any questions you have with your health care provider. °  °Document Released: 03/14/2013 Document Revised: 12/08/2014 Document Reviewed: 03/14/2013 °Elsevier Interactive Patient Education ©2016 [employer] ° °

## 2016-05-05 NOTE — ED Notes (Signed)
Pt currently speaking with psychiatrist. Pt is calm.  No distress noted. No concerns voiced. Maintained on 15 minute checks and observation by security camera for safety.

## 2016-05-05 NOTE — ED Notes (Signed)

## 2016-05-05 NOTE — ED Notes (Signed)
Patient asleep in room. No noted distress or abnormal behavior. Will continue 15 minute checks and observation by security cameras for safety. 
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
## 2016-05-05 NOTE — ED Notes (Signed)
Pt. To BHU from ED ambulatory without difficulty, to room  BHU-2. Report from [place] RN. Pt. Is alert and oriented, warm and dry in no distress. Pt. Denies SI, HI, and AVH. Pt. Calm and cooperative. Pt. Made aware of security cameras and Q15 minute rounds. Pt. Encouraged to let Nursing staff know of any concerns or needs.

## 2016-05-05 NOTE — ED Notes (Signed)
Patient ambulatory to triage with steady gait, without difficulty or distress noted, [facility] st just d/c for depression but "went into a couple of places where I was trying to get something accomplished but nothing panned out so I'm just depressed and don't feel safe"

## 2016-05-05 NOTE — ED Notes (Signed)
Pt denies SI/HI at this time.  

## 2016-05-05 NOTE — ED Notes (Signed)
Pt will be discharged. Plan for [facility]. All belongings will be returned to pt upon discharge. Maintained on 15 minute checks and observation by security camera for safety.

## 2016-05-05 NOTE — ED Notes (Signed)
Pt cooperative. Denies SI/HI and AVH. When asked by RN why he was in the hospital the pt stated, "There is so much going on." Pt did not wish to elaborate at this time because he was too sleepy. No distress noted. Maintained on 15 minute checks and observation by security camera for safety.

## 2016-07-29 ENCOUNTER — Encounter: Payer: Self-pay | Admitting: *Deleted

## 2016-07-29 ENCOUNTER — Emergency Department
Admission: EM | Admit: 2016-07-29 | Discharge: 2016-07-29 | Disposition: A | Discharge status: Home / Self Care | Payer: Self-pay | Attending: Emergency Medicine | Admitting: Emergency Medicine

## 2016-07-29 DIAGNOSIS — Z9889 Other specified postprocedural states: Secondary | ICD-10-CM | POA: Insufficient documentation

## 2016-07-29 DIAGNOSIS — Z791 Long term (current) use of non-steroidal anti-inflammatories (NSAID): Secondary | ICD-10-CM | POA: Insufficient documentation

## 2016-07-29 DIAGNOSIS — F17210 Nicotine dependence, cigarettes, uncomplicated: Secondary | ICD-10-CM | POA: Insufficient documentation

## 2016-07-29 DIAGNOSIS — R1012 Left upper quadrant pain: Secondary | ICD-10-CM | POA: Insufficient documentation

## 2016-07-29 DIAGNOSIS — R110 Nausea: Secondary | ICD-10-CM | POA: Insufficient documentation

## 2016-07-29 LAB — URINALYSIS COMPLETE WITH MICROSCOPIC (ARMC ONLY)
BACTERIA UA: NONE SEEN
BILIRUBIN URINE: NEGATIVE
Glucose, UA: NEGATIVE mg/dL
HGB URINE DIPSTICK: NEGATIVE
Ketones, ur: NEGATIVE mg/dL
LEUKOCYTES UA: NEGATIVE
NITRITE: NEGATIVE
PH: 6 (ref 5.0–8.0)
PROTEIN: NEGATIVE mg/dL
SQUAMOUS EPITHELIAL / LPF: NONE SEEN
Specific Gravity, Urine: 1.011 (ref 1.005–1.030)

## 2016-07-29 LAB — BASIC METABOLIC PANEL
ANION GAP: 6 (ref 5–15)
BUN: 21 mg/dL — ABNORMAL HIGH (ref 6–20)
CALCIUM: 9.7 mg/dL (ref 8.9–10.3)
CO2: 28 mmol/L (ref 22–32)
Chloride: 103 mmol/L (ref 101–111)
Creatinine, Ser: 1.2 mg/dL (ref 0.61–1.24)
GLUCOSE: 90 mg/dL (ref 65–99)
POTASSIUM: 4.7 mmol/L (ref 3.5–5.1)
Sodium: 137 mmol/L (ref 135–145)

## 2016-07-29 LAB — CBC WITH DIFFERENTIAL/PLATELET
BASOS ABS: 0.1 10*3/uL (ref 0–0.1)
Eosinophils Absolute: 0.3 10*3/uL (ref 0–0.7)
Eosinophils Relative: 3 %
HEMATOCRIT: 45.1 % (ref 40.0–52.0)
HEMOGLOBIN: 16 g/dL (ref 13.0–18.0)
Lymphs Abs: 3.2 10*3/uL (ref 1.0–3.6)
MCH: 34.8 pg — ABNORMAL HIGH (ref 26.0–34.0)
MCHC: 35.5 g/dL (ref 32.0–36.0)
MCV: 98 fL (ref 80.0–100.0)
Monocytes Absolute: 0.6 10*3/uL (ref 0.2–1.0)
Monocytes Relative: 6 %
NEUTROS ABS: 6.4 10*3/uL (ref 1.4–6.5)
Platelets: 244 10*3/uL (ref 150–440)
RBC: 4.61 MIL/uL (ref 4.40–5.90)
RDW: 12.9 % (ref 11.5–14.5)
WBC: 10.6 10*3/uL (ref 3.8–10.6)

## 2016-07-29 MED ORDER — KETOROLAC TROMETHAMINE 60 MG/2ML IM SOLN
60.0000 mg | Freq: Once | INTRAMUSCULAR | Status: AC
  Administered 2016-07-29: 60 mg via INTRAMUSCULAR
  Filled 2016-07-29: qty 2

## 2016-07-29 NOTE — ED Triage Notes (Signed)
Pt reports he has a hernia in left groin.  Dx 1 year ago.  Pt states he lifted heavy object today.  Pain is worse.  Pt also reports nausea.

## 2016-07-29 NOTE — ED Notes (Signed)
Ibuprofen not helping at home. Per pt.

## 2016-07-29 NOTE — ED Notes (Signed)
Pt. Stating hernia has now retracted and is no longer visible
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
## 2016-07-29 NOTE — ED Notes (Signed)
Pt. Requesting update from MD. This RN to let MD know as soon as he is available from current pt. Room.

## 2016-07-29 NOTE — ED Notes (Signed)
Pt. Verbalizes understanding of d/c instructions, and follow-up. VS stable. Pt. In NAD at time of d/c and denies further concerns regarding this visit. Pt. Ambulatory out of the unit with steady gait. Pt advised to return to the ED at any time for emergent concerns, or for new/worsening symptoms.

## 2016-07-29 NOTE — ED Notes (Signed)
Pt. States hernia pain increasing since lifting heavy object. Pt. Reports pain in the groin and pain shooting through thr RLQ abd.

## 2017-02-10 ENCOUNTER — Emergency Department
Admission: EM | Admit: 2017-02-10 | Discharge: 2017-02-10 | Disposition: A | Discharge status: Home / Self Care | Payer: Self-pay | Attending: Emergency Medicine | Admitting: Emergency Medicine

## 2017-02-10 ENCOUNTER — Encounter: Payer: Self-pay | Admitting: Emergency Medicine

## 2017-02-10 DIAGNOSIS — F1290 Cannabis use, unspecified, uncomplicated: Secondary | ICD-10-CM | POA: Insufficient documentation

## 2017-02-10 DIAGNOSIS — R1084 Generalized abdominal pain: Secondary | ICD-10-CM | POA: Insufficient documentation

## 2017-02-10 DIAGNOSIS — F17210 Nicotine dependence, cigarettes, uncomplicated: Secondary | ICD-10-CM | POA: Insufficient documentation

## 2017-02-10 DIAGNOSIS — Z79899 Other long term (current) drug therapy: Secondary | ICD-10-CM | POA: Insufficient documentation

## 2017-02-10 LAB — COMPREHENSIVE METABOLIC PANEL
ALK PHOS: 74 U/L (ref 38–126)
ALT: 44 U/L (ref 17–63)
ANION GAP: 6 (ref 5–15)
AST: 31 U/L (ref 15–41)
Albumin: 4.5 g/dL (ref 3.5–5.0)
BILIRUBIN TOTAL: 0.7 mg/dL (ref 0.3–1.2)
BUN: 6 mg/dL (ref 6–20)
CALCIUM: 9.4 mg/dL (ref 8.9–10.3)
CO2: 30 mmol/L (ref 22–32)
CREATININE: 0.92 mg/dL (ref 0.61–1.24)
Chloride: 102 mmol/L (ref 101–111)
GFR calc non Af Amer: >60 mL/min (ref >60)
Glucose, Bld: 85 mg/dL (ref 65–99)
Potassium: 4.9 mmol/L (ref 3.5–5.1)
Sodium: 138 mmol/L (ref 135–145)
TOTAL PROTEIN: 7.4 g/dL (ref 6.5–8.1)

## 2017-02-10 LAB — CBC
HCT: 44.2 % (ref 40.0–52.0)
HEMOGLOBIN: 15.6 g/dL (ref 13.0–18.0)
MCH: 34.5 pg — ABNORMAL HIGH (ref 26.0–34.0)
MCHC: 35.3 g/dL (ref 32.0–36.0)
MCV: 97.6 fL (ref 80.0–100.0)
PLATELETS: 276 10*3/uL (ref 150–440)
RBC: 4.53 MIL/uL (ref 4.40–5.90)
RDW: 13.4 % (ref 11.5–14.5)
WBC: 9.4 10*3/uL (ref 3.8–10.6)

## 2017-02-10 LAB — URINALYSIS, COMPLETE (UACMP) WITH MICROSCOPIC
BILIRUBIN URINE: NEGATIVE
Bacteria, UA: NONE SEEN
GLUCOSE, UA: NEGATIVE mg/dL
HGB URINE DIPSTICK: NEGATIVE
Ketones, ur: NEGATIVE mg/dL
Leukocytes, UA: NEGATIVE
NITRITE: NEGATIVE
Protein, ur: NEGATIVE mg/dL
RBC / HPF: NONE SEEN RBC/hpf (ref 0–5)
Specific Gravity, Urine: 1.008 (ref 1.005–1.030)
Squamous Epithelial / LPF: NONE SEEN
WBC UA: NONE SEEN WBC/hpf (ref 0–5)
pH: 6 (ref 5.0–8.0)

## 2017-02-10 MED ORDER — TRAMADOL HCL 50 MG PO TABS
50.0000 mg | ORAL_TABLET | Freq: Once | ORAL | Status: AC
  Administered 2017-02-10: 50 mg via ORAL
  Filled 2017-02-10: qty 1

## 2017-02-10 MED ORDER — NAPROXEN 500 MG PO TABS: 500.0000 mg | ORAL_TABLET | Freq: Two times a day (BID) | ORAL | 0 refills | Status: AC

## 2017-02-10 NOTE — ED Triage Notes (Addendum)
Pt c/o left inguinal hernia that has been popping in and out for past couple weeks. Also c/o pain just below umbilical area. Denies fevers. ambulatory to triage. No distress currently. Denies NVD. Feels like has trouble getting stream started when has to urinate. Pain worse when eating.
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
## 2017-02-10 NOTE — Discharge Instructions (Signed)
Please seek medical attention for any high fevers, chest pain, shortness of breath, change in behavior, persistent vomiting, bloody stool or any other new or concerning symptoms.  

## 2017-02-10 NOTE — ED Triage Notes (Addendum)
Arrives via [facility],  Arrives from home.  Had some N/V today and since today unable to reduce inguinal hernia.

## 2017-02-10 NOTE — ED Notes (Signed)
Pt c/o abd hernia [id number], states usually able to push back in himself but this time was different, pt c/o severe intermit pain

## 2018-04-10 ENCOUNTER — Emergency Department
Admission: EM | Admit: 2018-04-10 | Discharge: 2018-04-10 | Disposition: A | Discharge status: Home / Self Care | Payer: Self-pay | Attending: Emergency Medicine | Admitting: Emergency Medicine

## 2018-04-10 ENCOUNTER — Emergency Department: Payer: Self-pay

## 2018-04-10 DIAGNOSIS — Z79899 Other long term (current) drug therapy: Secondary | ICD-10-CM | POA: Insufficient documentation

## 2018-04-10 DIAGNOSIS — F17210 Nicotine dependence, cigarettes, uncomplicated: Secondary | ICD-10-CM | POA: Insufficient documentation

## 2018-04-10 DIAGNOSIS — R569 Unspecified convulsions: Secondary | ICD-10-CM | POA: Insufficient documentation

## 2018-04-10 LAB — CBC
HEMATOCRIT: 42.9 % (ref 40.0–52.0)
HEMOGLOBIN: 15 g/dL (ref 13.0–18.0)
MCH: 34.3 pg — AB (ref 26.0–34.0)
MCHC: 35 g/dL (ref 32.0–36.0)
MCV: 98 fL (ref 80.0–100.0)
Platelets: 261 10*3/uL (ref 150–440)
RBC: 4.38 MIL/uL — ABNORMAL LOW (ref 4.40–5.90)
RDW: 12 % (ref 11.5–14.5)
WBC: 8.2 10*3/uL (ref 3.8–10.6)

## 2018-04-10 LAB — COMPREHENSIVE METABOLIC PANEL
ALBUMIN: 4.1 g/dL (ref 3.5–5.0)
ALK PHOS: 81 U/L (ref 38–126)
ALT: 17 U/L (ref 17–63)
ANION GAP: 8 (ref 5–15)
AST: 27 U/L (ref 15–41)
BILIRUBIN TOTAL: 0.4 mg/dL (ref 0.3–1.2)
BUN: <5 mg/dL — ABNORMAL LOW (ref 6–20)
CALCIUM: 8.7 mg/dL — AB (ref 8.9–10.3)
CO2: 25 mmol/L (ref 22–32)
Chloride: 102 mmol/L (ref 101–111)
Creatinine, Ser: 1 mg/dL (ref 0.61–1.24)
GLUCOSE: 81 mg/dL (ref 65–99)
Potassium: 3.8 mmol/L (ref 3.5–5.1)
Sodium: 135 mmol/L (ref 135–145)
TOTAL PROTEIN: 6.6 g/dL (ref 6.5–8.1)

## 2018-04-10 LAB — URINE DRUG SCREEN, QUALITATIVE (ARMC ONLY)
Amphetamines, Ur Screen: NOT DETECTED
BARBITURATES, UR SCREEN: NOT DETECTED
BENZODIAZEPINE, UR SCRN: NOT DETECTED
Cannabinoid 50 Ng, Ur ~~LOC~~: POSITIVE — AB
Cocaine Metabolite,Ur ~~LOC~~: NOT DETECTED
MDMA (Ecstasy)Ur Screen: NOT DETECTED
METHADONE SCREEN, URINE: NOT DETECTED
Opiate, Ur Screen: NOT DETECTED
Phencyclidine (PCP) Ur S: NOT DETECTED
Tricyclic, Ur Screen: NOT DETECTED

## 2018-04-10 LAB — TROPONIN I

## 2018-04-10 MED ORDER — ACETAMINOPHEN 500 MG PO TABS
1000.0000 mg | ORAL_TABLET | Freq: Once | ORAL | Status: AC
  Administered 2018-04-10: 1000 mg via ORAL
  Filled 2018-04-10: qty 2
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
## 2018-04-10 NOTE — Discharge Instructions (Addendum)
Please follow-up with a primary care doctor for recheck/reevaluation.  Return to the emergency department if you have any further seizure or seizure-like activity.  Please drink plenty of fluids, limit caffeine/stimulant intake, and obtain plenty of sleep.

## 2018-04-10 NOTE — ED Triage Notes (Signed)
Pt presents via EMS c/o witnessed seizure. No history of seizure. Hx of alcoholism per report. No ETOH per pt report.

## 2018-04-10 NOTE — ED Notes (Signed)
Patient transported to CT 

## 2018-04-16 ENCOUNTER — Emergency Department
Admission: EM | Admit: 2018-04-16 | Discharge: 2018-04-16 | Disposition: A | Discharge status: Home / Self Care | Payer: Self-pay | Attending: Emergency Medicine | Admitting: Emergency Medicine

## 2018-04-16 ENCOUNTER — Encounter: Payer: Self-pay | Admitting: Emergency Medicine

## 2018-04-16 DIAGNOSIS — Z79899 Other long term (current) drug therapy: Secondary | ICD-10-CM | POA: Insufficient documentation

## 2018-04-16 DIAGNOSIS — F1210 Cannabis abuse, uncomplicated: Secondary | ICD-10-CM | POA: Insufficient documentation

## 2018-04-16 DIAGNOSIS — F4325 Adjustment disorder with mixed disturbance of emotions and conduct: Secondary | ICD-10-CM | POA: Insufficient documentation

## 2018-04-16 DIAGNOSIS — F1010 Alcohol abuse, uncomplicated: Secondary | ICD-10-CM | POA: Insufficient documentation

## 2018-04-16 DIAGNOSIS — R569 Unspecified convulsions: Secondary | ICD-10-CM | POA: Insufficient documentation

## 2018-04-16 DIAGNOSIS — F17210 Nicotine dependence, cigarettes, uncomplicated: Secondary | ICD-10-CM | POA: Insufficient documentation

## 2018-04-16 LAB — CBC
HCT: 44.1 % (ref 40.0–52.0)
Hemoglobin: 15.4 g/dL (ref 13.0–18.0)
MCH: 33.5 pg (ref 26.0–34.0)
MCHC: 34.9 g/dL (ref 32.0–36.0)
MCV: 96 fL (ref 80.0–100.0)
PLATELETS: 280 10*3/uL (ref 150–440)
RBC: 4.6 MIL/uL (ref 4.40–5.90)
RDW: 12.1 % (ref 11.5–14.5)
WBC: 10.7 10*3/uL — AB (ref 3.8–10.6)

## 2018-04-16 LAB — BASIC METABOLIC PANEL
Anion gap: 13 (ref 5–15)
BUN: 8 mg/dL (ref 6–20)
CO2: 21 mmol/L — ABNORMAL LOW (ref 22–32)
CREATININE: 0.95 mg/dL (ref 0.61–1.24)
Calcium: 9 mg/dL (ref 8.9–10.3)
Chloride: 101 mmol/L (ref 101–111)
Glucose, Bld: 92 mg/dL (ref 65–99)
Potassium: 3.9 mmol/L (ref 3.5–5.1)
SODIUM: 135 mmol/L (ref 135–145)

## 2018-04-16 MED ORDER — PHENYTOIN SODIUM EXTENDED 100 MG PO CAPS: 100.0000 mg | ORAL_CAPSULE | Freq: Three times a day (TID) | ORAL | 1 refills | Status: DC
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
## 2018-04-16 NOTE — ED Triage Notes (Signed)
Pt comes into the ED via [facility] from his place of employment where he had a syncopal episode and seizure like activity.  Patient denies any seizure history.  Patient coworkers explained it as a jerking shaking motion when he passed out.  Patient was confused upon waking up and unable to answer all question for EMS.  No loss of bowel or bladder.  CBG 57 and VSS.

## 2018-09-25 ENCOUNTER — Encounter: Payer: Self-pay | Admitting: Emergency Medicine

## 2018-09-25 ENCOUNTER — Emergency Department
Admission: EM | Admit: 2018-09-25 | Discharge: 2018-09-25 | Discharge status: Against Medical Advice | Payer: Self-pay | Attending: Emergency Medicine | Admitting: Emergency Medicine

## 2018-09-25 DIAGNOSIS — Z5321 Procedure and treatment not carried out due to patient leaving prior to being seen by health care provider: Secondary | ICD-10-CM | POA: Insufficient documentation

## 2018-09-25 DIAGNOSIS — K469 Unspecified abdominal hernia without obstruction or gangrene: Secondary | ICD-10-CM | POA: Insufficient documentation

## 2018-09-25 NOTE — ED Triage Notes (Signed)
Pt arrives with complaints of pain and attributes it to known hernia. Pt states he wears a belt and has consulted with a surgeon about surgery but does not have insurance. Pt states he has not been able to control the pain and "wants to make sure it isn't dangerous."

## 2018-09-28 ENCOUNTER — Telehealth: Payer: Self-pay | Admitting: Emergency Medicine

## 2018-09-28 NOTE — Telephone Encounter (Signed)
Called patient due to lwot to inquire about condition and follow up plans. Left message.   

## 2020-08-29 ENCOUNTER — Other Ambulatory Visit: Payer: Self-pay

## 2020-08-29 ENCOUNTER — Emergency Department: Payer: Self-pay

## 2020-08-29 ENCOUNTER — Encounter: Payer: Self-pay | Admitting: Emergency Medicine

## 2020-08-29 ENCOUNTER — Emergency Department
Admission: EM | Admit: 2020-08-29 | Discharge: 2020-08-29 | Disposition: A | Discharge status: Home / Self Care | Payer: Self-pay | Attending: Emergency Medicine | Admitting: Emergency Medicine

## 2020-08-29 DIAGNOSIS — Z20822 Contact with and (suspected) exposure to covid-19: Secondary | ICD-10-CM | POA: Insufficient documentation

## 2020-08-29 DIAGNOSIS — F17210 Nicotine dependence, cigarettes, uncomplicated: Secondary | ICD-10-CM | POA: Insufficient documentation

## 2020-08-29 DIAGNOSIS — K4090 Unilateral inguinal hernia, without obstruction or gangrene, not specified as recurrent: Secondary | ICD-10-CM | POA: Insufficient documentation

## 2020-08-29 DIAGNOSIS — K4091 Unilateral inguinal hernia, without obstruction or gangrene, recurrent: Secondary | ICD-10-CM

## 2020-08-29 LAB — CBC WITH DIFFERENTIAL/PLATELET
Abs Immature Granulocytes: 0.01 10*3/uL (ref 0.00–0.07)
Basophils Absolute: 0.1 10*3/uL (ref 0.0–0.1)
Basophils Relative: 2 %
Eosinophils Absolute: 0.1 10*3/uL (ref 0.0–0.5)
Eosinophils Relative: 2 %
HCT: 44.7 % (ref 39.0–52.0)
Hemoglobin: 15.4 g/dL (ref 13.0–17.0)
Immature Granulocytes: 0 %
Lymphocytes Relative: 27 %
Lymphs Abs: 1.3 10*3/uL (ref 0.7–4.0)
MCH: 34.8 pg — ABNORMAL HIGH (ref 26.0–34.0)
MCHC: 34.5 g/dL (ref 30.0–36.0)
MCV: 101.1 fL — ABNORMAL HIGH (ref 80.0–100.0)
Monocytes Absolute: 0.4 10*3/uL (ref 0.1–1.0)
Monocytes Relative: 9 %
Neutro Abs: 2.9 10*3/uL (ref 1.7–7.7)
Neutrophils Relative %: 60 %
Platelets: 235 10*3/uL (ref 150–400)
RBC: 4.42 MIL/uL (ref 4.22–5.81)
RDW: 12.3 % (ref 11.5–15.5)
WBC: 4.8 10*3/uL (ref 4.0–10.5)
nRBC: 0 % (ref 0.0–0.2)

## 2020-08-29 LAB — COMPREHENSIVE METABOLIC PANEL
ALT: 28 U/L (ref 0–44)
AST: 31 U/L (ref 15–41)
Albumin: 4.1 g/dL (ref 3.5–5.0)
Alkaline Phosphatase: 66 U/L (ref 38–126)
Anion gap: 10 (ref 5–15)
BUN: 11 mg/dL (ref 6–20)
CO2: 26 mmol/L (ref 22–32)
Calcium: 8.8 mg/dL — ABNORMAL LOW (ref 8.9–10.3)
Chloride: 104 mmol/L (ref 98–111)
Creatinine, Ser: 0.78 mg/dL (ref 0.61–1.24)
GFR calc Af Amer: >60 mL/min (ref >60)
GFR calc non Af Amer: >60 mL/min (ref >60)
Glucose, Bld: 100 mg/dL — ABNORMAL HIGH (ref 70–99)
Potassium: 3.7 mmol/L (ref 3.5–5.1)
Sodium: 140 mmol/L (ref 135–145)
Total Bilirubin: 0.5 mg/dL (ref 0.3–1.2)
Total Protein: 7 g/dL (ref 6.5–8.1)

## 2020-08-29 LAB — RESPIRATORY PANEL BY RT PCR (FLU A&B, COVID)
Influenza A by PCR: NEGATIVE
Influenza B by PCR: NEGATIVE
SARS Coronavirus 2 by RT PCR: NEGATIVE

## 2020-08-29 MED ORDER — IOHEXOL 300 MG/ML  SOLN
100.0000 mL | Freq: Once | INTRAMUSCULAR | Status: AC | PRN
  Administered 2020-08-29: 100 mL via INTRAVENOUS

## 2020-08-29 NOTE — ED Notes (Signed)
Pt provided with box lunch before dc

## 2020-08-29 NOTE — Discharge Instructions (Addendum)
Wear the abdominal binder at all times to prevent recurrence of the trapped hernia.  Avoid leaning over, straining to have a bowel movement, or lifting heavy weight.  Please follow up with the [facility], who can arrange referral to general surgery for further evaluation.

## 2020-08-29 NOTE — ED Notes (Signed)
Hernia reduced by MD, abd binder provided for pt to wear
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
## 2020-08-29 NOTE — Congregational Nurse Program (Signed)
BP screening 138/98. Complaining of severe pain the last couple of days due to "flare-up" of bilingual hernia. Unable to sleep last night due to pain. Hernia belt has worn out so holding scrotum supported while ambulating. Plans on taking bus to ED for care today. No insurance, no PCP, no meds. Plan:  Started [facility] application.[place]villa Fus will take to clinic when goes today.  Discussed using urgent care [facility] [place]) vs. ED if they will see without insurance. Co. To let staff know he no longer has a hernia support belt. Referred to [facility] to establish primary care after this acute issue. Pt. To follow up with this nurse as to outcome and if there are further health resource needs.

## 2020-08-29 NOTE — ED Triage Notes (Signed)
Here for inguinal hernia.  Hx of same but over last 5 days increased in size and will not go back in.  Pain has increased.  Difficulty starting urine stream. Has been wearing hernia belt without improvement.

## 2020-08-29 NOTE — ED Notes (Signed)
Pt ambulates with steady gait, no distress noted, abd binder in place.

## 2020-08-29 NOTE — ED Triage Notes (Addendum)
Emergency Medicine Provider Triage Evaluation Note  Jeffrey Wilkins , a 54 y.o. male  was evaluated in triage.  Pt complains of worsening left-sided inguinal hernia.  Patient states that he has had a hernia for some time, typically it is very "small" and easily reducible when he lays down.  Patient has had increasing pain over the past 4 to 5 days.  He states that the area is significantly enlarging, becoming very painful.  Patient is unable to reduce the area.  Patient states that now he is having difficulty urinating as well as having constipation.  Patient states that he has been wearing a hernia belt, however for the past several days he cannot tolerate any pressure into the area..  Review of Systems  Positive: Left inguinal pain, bulging, non-reducible hernia.  Positive for difficulty urinating, constipation.   Negative: No emesis or diarrhea.  No fevers.  Physical Exam  BP (!) 142/93   Pulse 77   Temp 98.8 F (37.1 C) (Oral)   Resp 16   Ht 5\' 8"  (1.727 m)   Wt 65.8 kg   SpO2 99%   BMI 22.05 kg/m  Gen:   Awake, no distress   HEENT:  Atraumatic  Resp:  Normal effort  Cardiac:  Normal rate  Abd:   Nondistended, nontender to palpation all quadrants.  Visualization of the left groin reveals significant bulging in the left inguinal region.  Area is exquisitely tender to palpation.  Palpation is unable to reduce hernia.   MSK:   Moves extremities without difficulty  Neuro:  Speech clear   Medical Decision Making  Medically screening exam initiated at 5:12 PM.  Appropriate orders placed.  Jeffrey Wilkins was informed that the remainder of the evaluation will be completed by another provider, this initial triage assessment does not replace that evaluation, and the importance of remaining in the ED until their evaluation is complete.  Clinical Impression  Inguinal hernia.  Patient presented to emergency department for increased pain, edema in the left inguinal region.  Patient has a known  hernia but states that it is typically nonpainful, small, easily reducible.  Over the past 4 to 5 days he has had increased edema in this region, increasing pain.  He states that he is now constipated and having decreased urination.  On exam, patient has findings consistent with a large, likely incarcerated hernia in the left inguinal region.  Patient will have labs, CT scan performed at this time.  Patient has had a medical screening exam in order to place appropriate labs and imaging.  When there is a room available, patient will be placed into a bed in the emergency department for ongoing evaluation and management of this complaint.   Massie Maroon, PA-C 08/29/20 1716    08/31/20, PA-C 08/29/20 1718    08/31/20, MD 08/29/20 2004

## 2021-07-14 IMAGING — CT CT ABD-PELV W/ CM
2 of 5 series · 15 of 46 positions shown, 17 images · IV contrast (APPLIED)
Comparison: CT abdomen pelvis 11/29/2015

CLINICAL DATA: Inguinal hernia. Increased in size recently and will
not go back and. Increased pain.

EXAM:
CT ABDOMEN AND PELVIS WITH CONTRAST
TECHNIQUE: Multidetector CT imaging of the abdomen and pelvis was performed
using the standard protocol following bolus administration of
intravenous contrast.
CONTRAST:  100mL OMNIPAQUE IOHEXOL 300 MG/ML  SOLN

[Series 2: axial st · axial · 0.71mm/px · z∈[-1177,-732]mm · 12 of 99 slices shown, 14 images]
[im 5/99  soft-tissue]
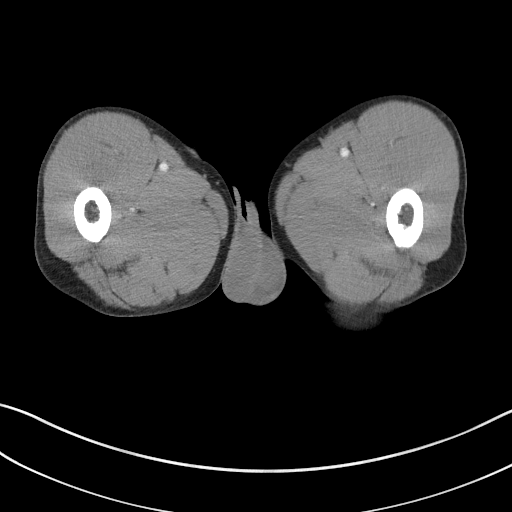
[im 5/99  bone]
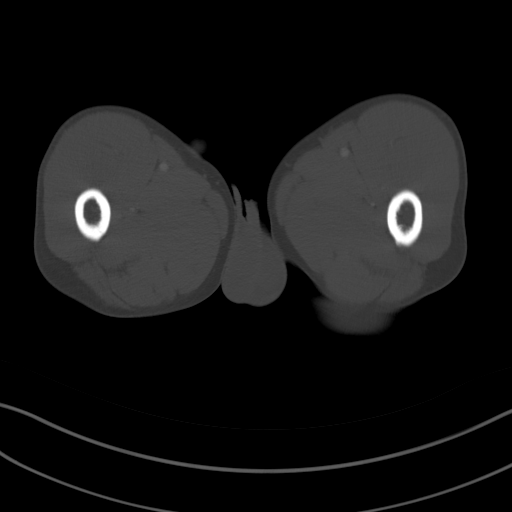
[im 15/99  soft-tissue]
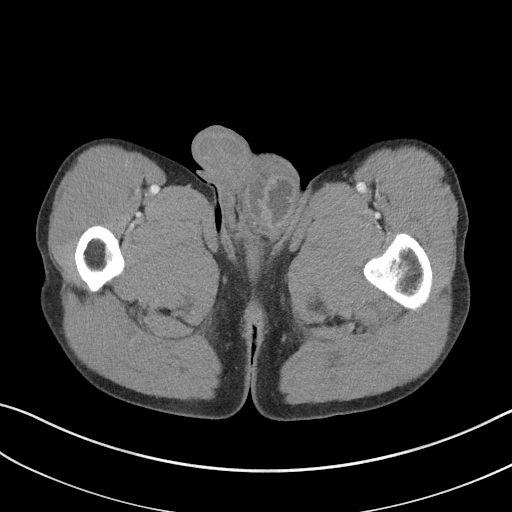
[im 24/99  soft-tissue]
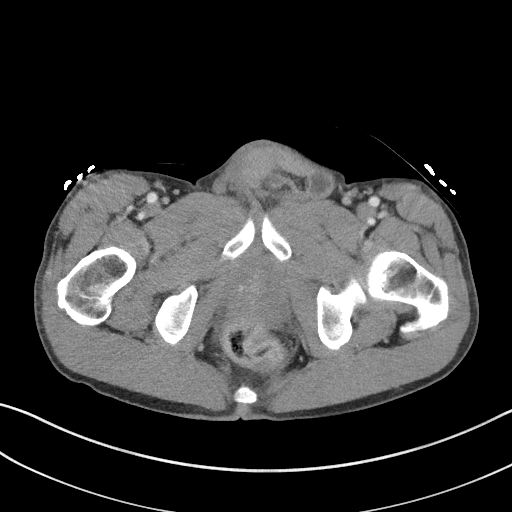
[im 29/99  soft-tissue]
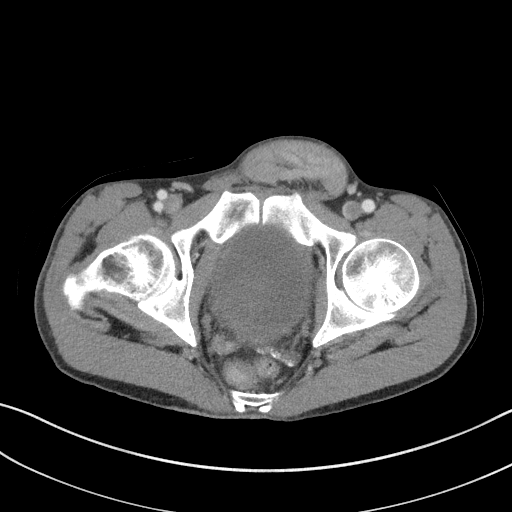
[im 38/99  soft-tissue]
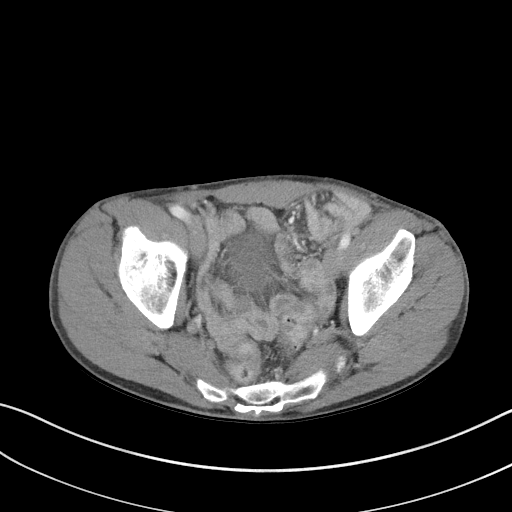
[im 47/99  soft-tissue]
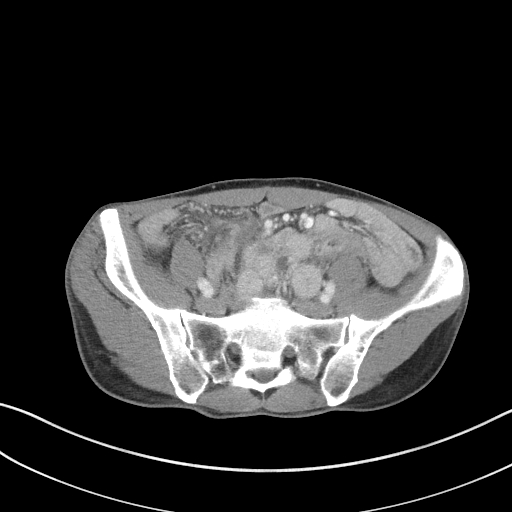
[im 52/99  soft-tissue]
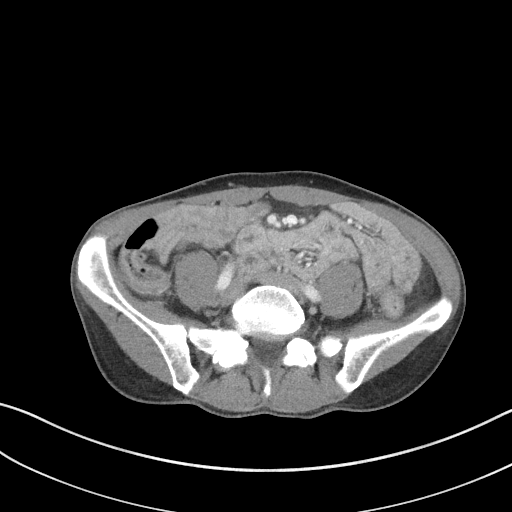
[im 61/99  soft-tissue]
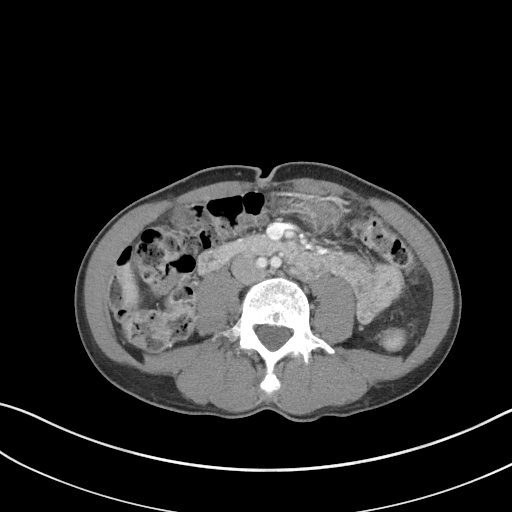
[im 71/99  soft-tissue]
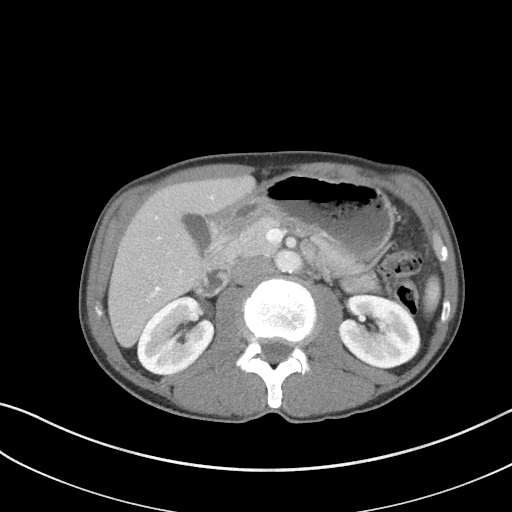
[im 71/99  bone]
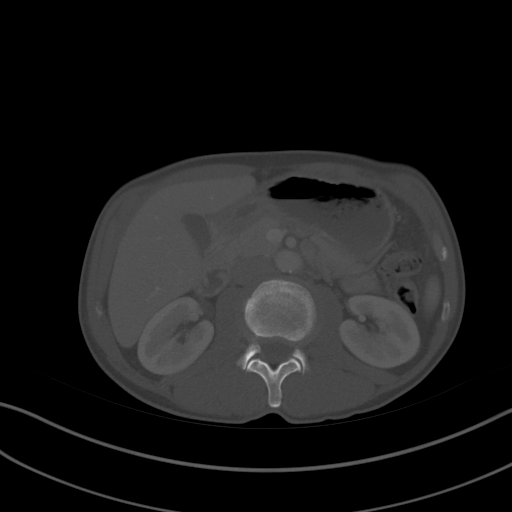
[im 75/99  soft-tissue]
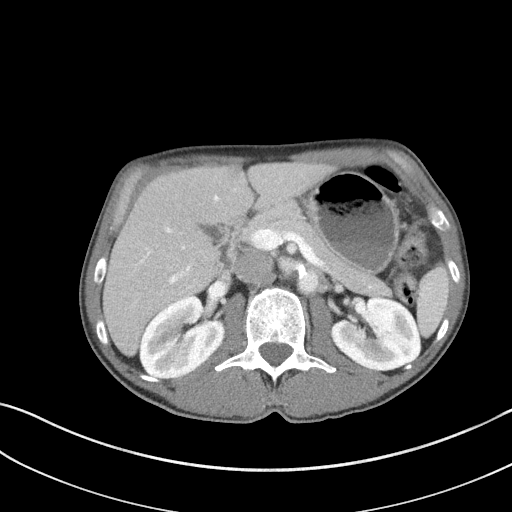
[im 85/99  soft-tissue]
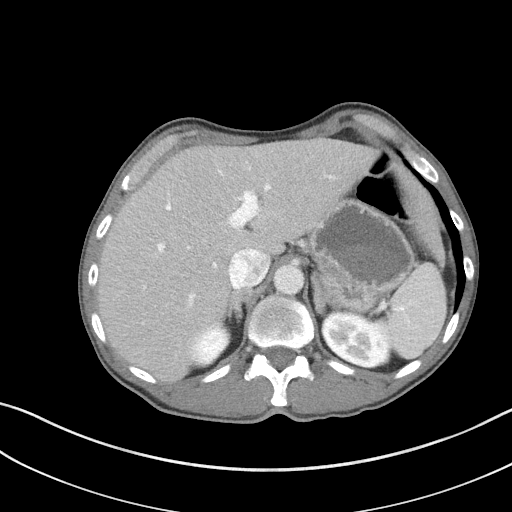
[im 94/99  soft-tissue]
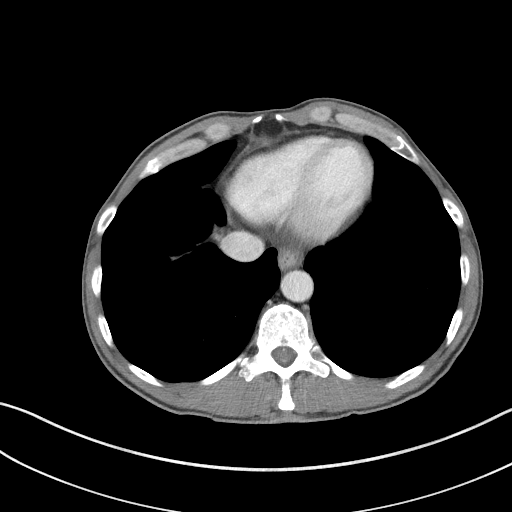

[Series 5: coronal st · coronal · 0.65mm/px · 3 of 75 slices shown]
[im 25/75  soft-tissue]
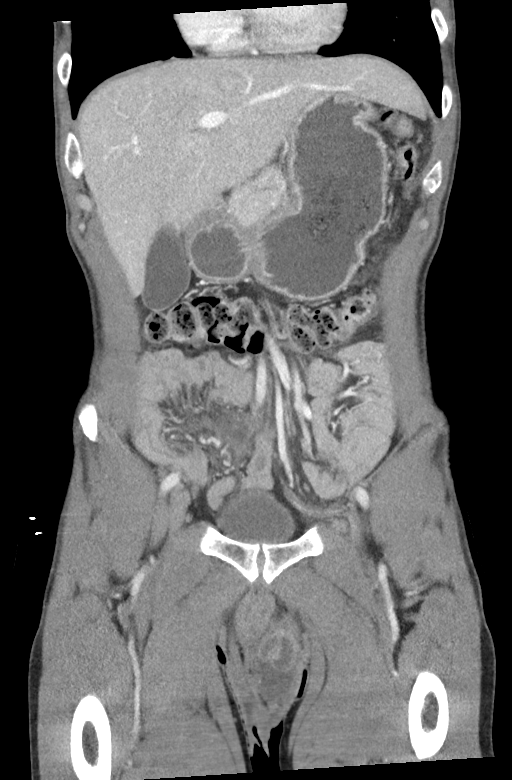
[im 33/75  soft-tissue]
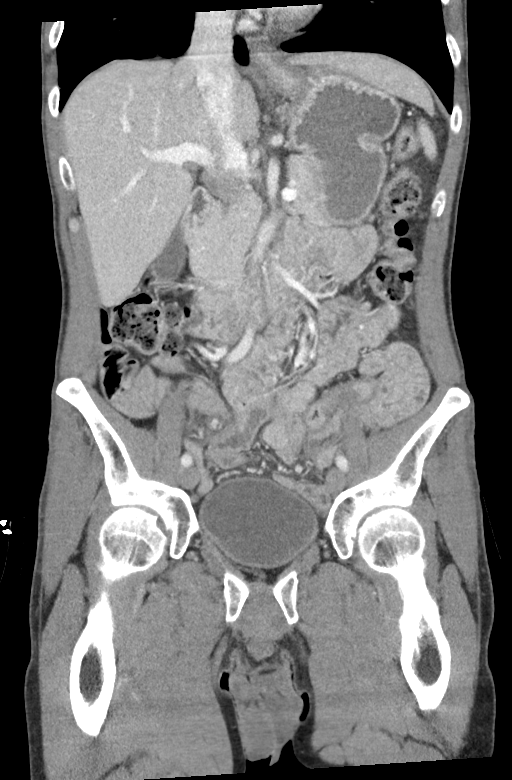
[im 42/75  soft-tissue]
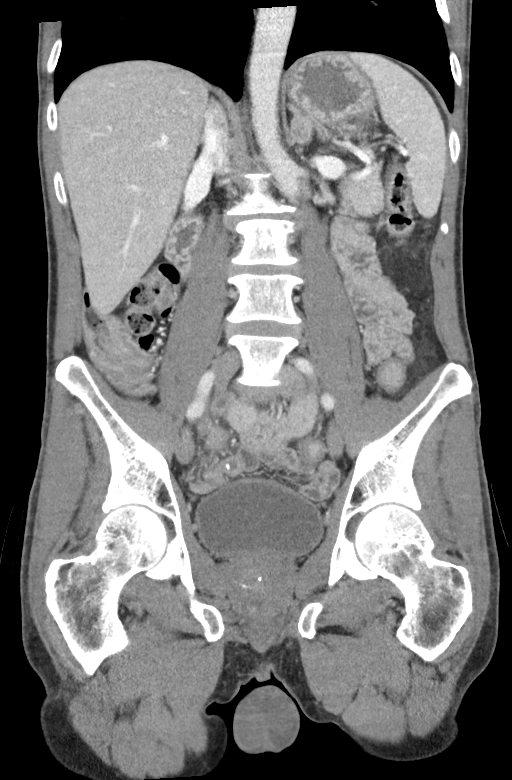

[15 of 46 positions shown; findings below may reference images not displayed]

FINDINGS: Lower chest: No acute abnormality.

Hepatobiliary: No focal liver abnormality is seen. No gallstones,
gallbladder wall thickening, or biliary dilatation. Mild focal fatty
sparing along the falciform ligament.

Pancreas: Unremarkable. No pancreatic ductal dilatation or
surrounding inflammatory changes.

Spleen: Normal in size without focal abnormality.

Adrenals/Urinary Tract: Similar small (1.0 cm) nodular low area of
attenuation in the the left adrenal gland, incompletely evaluated on
this study. Symmetric renal enhancement. No hydronephrosis. No
calculi identified. The bladder is distended.

Stomach/Bowel: Stomach is within normal limits. Appendix is not well
visualized. No evidence of bowel wall thickening, distention, or
inflammatory changes.

Vascular/Lymphatic: Calcific atherosclerosis. No significant
vascular findings are present. No enlarged abdominal or pelvic lymph
nodes.

Reproductive: Redemonstrated borderline enlargement of the prostate
with nonspecific prostatic calcifications.

Musculoskeletal: Interval development of a large left inguinal
hernia (measuring up to 3.8 by 7.6 cm) which contains multiple loops
of small bowel. There is no evidence of proximal small bowel
dilation to suggest obstruction at this time. Small bowel loops
within the hernia are enhancing. Mild surrounding edema. No acute
osseous abnormality. Mild multilevel degenerative change in the
lumbar spine.
IMPRESSION: Large left inguinal hernia which contains multiple loops of small
bowel. No proximal bowel dilation to suggest obstruction at this
time. Recommend clinical correlation for incarceration.

## 2022-03-19 ENCOUNTER — Ambulatory Visit: Payer: Self-pay | Admitting: Gerontology

## 2022-04-09 NOTE — Congregational Nurse Program (Signed)
?  Dept: [phone] ? ? ?Congregational Nurse Program Note ? ?Date of Encounter: 04/09/2022 ?Client to clinic in need of a hernia support belt.He was agreeable to an appointment at [facility]. RN made appointment for 5/16 at 1 pm. Client aware and agreement. Hernia support belt to be ordered and provided. ?Past Medical History: ?Past Medical History:  ?Diagnosis Date  ? Inguinal hernia   ? Pancreatitis   ? ? ?Encounter Details: ? CNP Questionnaire - 04/09/22 1229   ? ?  ? Questionnaire  ? Do you give verbal consent to treat you today? Yes   ? Location Patient Served  [place]   ? Visit Setting Church or Organization   ? Patient Status Homeless   ? Insurance Uninsured ([employer]/Care Connects/Self-Pay)   ? Insurance Referral N/A   ? Medication Have Medication Insecurities   not currenlty taking medication, if he goes to appt at [facility] medications will be provided at Medication management  ? Medical Provider No   ? Screening Referrals N/A   ? Medical Referral Other   Appointment made at [facility] on 5/16 at 1pm  ? Medical Appointment Made Other   [facility] 5/16 at 1 pm  ? Food Have Food Insecurities   ? Transportation N/A   [employer] bus or friends provide transportation  ? Housing/Utilities No permanent housing   currenlty living in a tent, in the woods with a friend  ? Interpersonal Safety N/A   ? Intervention Case Management   ? ED Visit Averted N/A   ? Life-Saving Intervention Made N/A   ? ?  ?  ? ?  ? ? ? ? ?

## 2022-04-15 ENCOUNTER — Ambulatory Visit: Payer: Self-pay | Admitting: Gerontology

## 2022-05-28 ENCOUNTER — Emergency Department
Admission: EM | Admit: 2022-05-28 | Discharge: 2022-05-28 | Discharge status: Against Medical Advice | Payer: Self-pay | Attending: Emergency Medicine | Admitting: Emergency Medicine

## 2022-05-28 ENCOUNTER — Other Ambulatory Visit: Payer: Self-pay

## 2022-05-28 ENCOUNTER — Encounter: Payer: Self-pay | Admitting: Intensive Care

## 2022-05-28 DIAGNOSIS — K4090 Unilateral inguinal hernia, without obstruction or gangrene, not specified as recurrent: Secondary | ICD-10-CM | POA: Insufficient documentation

## 2022-05-28 DIAGNOSIS — L0231 Cutaneous abscess of buttock: Secondary | ICD-10-CM | POA: Insufficient documentation

## 2022-05-28 DIAGNOSIS — Z5321 Procedure and treatment not carried out due to patient leaving prior to being seen by health care provider: Secondary | ICD-10-CM | POA: Insufficient documentation

## 2022-05-28 NOTE — ED Triage Notes (Signed)
Patient c/o left buttocks abscess. Denies drainage. Also c/o hernia. Denies sob

## 2022-08-11 NOTE — Congregational Nurse Program (Signed)
  Dept: [phone]   Congregational Nurse Program Note  Date of Encounter: 08/11/2022 Client to clinic with request for assistance with a dental appointment and eye exam. Applications completed and emailed to [facility]. Reading glasses given. Client appreciative of care provided. Past Medical History: Past Medical History:  Diagnosis Date   Inguinal hernia    Pancreatitis     Encounter Details:  CNP Questionnaire - 08/11/22 1113       Questionnaire   Do you give verbal consent to treat you today? Yes    Location Patient Served  [facility]    Visit Setting Church or Organization    Patient Status Homeless    Insurance Uninsured ([employer] Card/Care Connects/Self-Pay)    Insurance Referral N/A    Medication N/A   not curenlty taking medications   Medical Provider Yes   [facility]   Screening Referrals N/A    Medical Referral Dental;Vision    Medical Appointment Made N/A    Food Have Food Insecurities    Transportation N/A    Housing/Utilities No permanent housing    Interpersonal Safety N/A    Intervention Case Management;Blood pressure    ED Visit Averted N/A    Life-Saving Intervention Made N/A

## 2022-08-14 NOTE — Congregational Nurse Program (Signed)
  Dept: [phone]   Congregational Nurse Program Note  Date of Encounter: 08/14/2022 Client to [facility] clinic for assistance with making dental and vision appointments. VP certificate has been received from [facility] in [place]. Rn was able to make an appointment at [facility] in [place] for 10/30 at 11:15. Rn also made an appointment at [facility] dentistry for 9/15 at 12:45. Client aware and has no transportation needs, he plans to take the [employer] bus. No other needs at this time. Past Medical History: Past Medical History:  Diagnosis Date   Inguinal hernia    Pancreatitis     Encounter Details:  CNP Questionnaire - 08/14/22 1000       Questionnaire   Do you give verbal consent to treat you today? Yes    Location Patient Served  [facility]    Visit Setting Church or Organization    Patient Status Homeless    Insurance Uninsured ([employer] Card/Care Connects/Self-Pay)    Insurance Referral N/A    Medication N/A   not curenlty taking medications   Medical Provider Yes   [facility]   Screening Referrals N/A    Medical Referral N/A    Medical Appointment Made N/A;Dental;Vision   Dental apt at [facility] dentistry on 9/15 at 12:45, vision apt at [facility] eye care on 10/30 at 11:15   Food Have Food Insecurities   has received his new food stamp card.   Transportation N/A    Housing/Utilities No permanent housing    Economist N/A    Intervention Case Management;[facility];Support    ED Visit Averted N/A    Life-Saving Intervention Made N/A

## 2022-09-25 NOTE — Congregational Nurse Program (Signed)
  Dept: [phone]   Congregational Nurse Program Note  Date of Encounter: 09/25/2022 Client to [facility] clinic with request for assistance filling out the form for his upcoming eye exam at [facility] eye in [place]. Forms completed and [facility] certificate given to client. He voiced understanding that the certificate could not be replaced if lost. Client voiced understanding. Apt is 10/30 at 11:15. Past Medical History: Past Medical History:  Diagnosis Date   Inguinal hernia    Pancreatitis     Encounter Details:  CNP Questionnaire - 09/25/22 1100       Questionnaire   Ask client: Do you give verbal consent for me to treat you today? Yes    Student Assistance N/A    Location Patient [place]    Visit Setting with Client Organization    Patient Status Unhoused    Insurance Uninsured ([place] Card/Care Connects/Self-Pay/Medicaid Family Planning)    Insurance/Financial Assistance Referral N/A    Medication N/A    Medical Provider Yes   [facility]   Screening Referrals Made N/A    Medical Referrals Made N/A    Medical Appointment Made N/A;Vision   Dental apt at [facility] dentistry on 9/15 at 12:45, vision apt at [facility] eye care on 10/30 at [place]   has received his new food stamp card.   Transportation N/A    Housing/Utilities No permanent housing    Chiropractor N/A    Interventions Advocate/Support;Case Management    Abnormal to Normal Screening Since Last CN Visit N/A    Screenings CN Performed N/A    Sent Client to Lab for: N/A    Did client attend any of the following based off CNs referral or appointments made? N/A    ED Visit Averted N/A    Life-Saving Intervention Made N/A

## 2022-11-05 ENCOUNTER — Encounter: Payer: Self-pay | Admitting: Gerontology

## 2022-11-05 ENCOUNTER — Other Ambulatory Visit: Payer: Self-pay

## 2022-11-05 ENCOUNTER — Ambulatory Visit: Payer: Self-pay | Admitting: Gerontology

## 2022-11-05 VITALS — BP 136/90 | HR 75 | Temp 98.2°F | Resp 16 | Wt 126.2 lb

## 2022-11-05 DIAGNOSIS — Z7689 Persons encountering health services in other specified circumstances: Secondary | ICD-10-CM | POA: Insufficient documentation

## 2022-11-05 DIAGNOSIS — L0292 Furuncle, unspecified: Secondary | ICD-10-CM | POA: Insufficient documentation

## 2022-11-05 DIAGNOSIS — F17200 Nicotine dependence, unspecified, uncomplicated: Secondary | ICD-10-CM | POA: Insufficient documentation

## 2022-11-05 MED ORDER — DOXYCYCLINE HYCLATE 50 MG PO CAPS
100.0000 mg | ORAL_CAPSULE | Freq: Two times a day (BID) | ORAL | 0 refills | Status: DC
  Filled 2022-11-05: qty 20, 5d supply, fill #0

## 2022-11-05 NOTE — Patient Instructions (Signed)
Smoking Tobacco Information, Adult Smoking tobacco can be harmful to your health. Tobacco contains a toxic colorless chemical called nicotine. Nicotine causes changes in your brain that make you want more and more. This is called addiction. This can make it hard to stop smoking once you start. Tobacco also has other toxic chemicals that can hurt your body and raise your risk of many cancers. Menthol or "lite" tobacco or cigarette brands are not safer than regular brands. How can smoking tobacco affect me? Smoking tobacco puts you at risk for: Cancer. Smoking is most commonly associated with lung cancer, but can also lead to cancer in other parts of the body. Chronic obstructive pulmonary disease (COPD). This is a long-term lung condition that makes it hard to breathe. It also gets worse over time. High blood pressure (hypertension), heart disease, stroke, heart attack, and lung infections, such as pneumonia. Cataracts. This is when the lenses in the eyes become clouded. Digestive problems. This may include peptic ulcers, heartburn, and gastroesophageal reflux disease (GERD). Oral health problems, such as gum disease, mouth sores, and tooth loss. Loss of taste and smell. Smoking also affects how you look and smell. Smoking may cause: Wrinkles. Yellow or stained teeth, fingers, and fingernails. Bad breath. Bad-smelling clothes and hair. Smoking tobacco can also affect your social life, because: It may be challenging to find places to smoke when away from home. Many workplaces, restaurants, hotels, and public places are tobacco-free. Smoking is expensive. This is due to the cost of tobacco and the long-term costs of treating health problems from smoking. Secondhand smoke may affect those around you. Secondhand smoke can cause lung cancer, breathing problems, and heart disease. Children of smokers have a higher risk for: Sudden infant death syndrome (SIDS). Ear infections. Lung infections. What  actions can I take to prevent health problems? Quit smoking  Do not start smoking. Quit if you already smoke. Do not replace cigarette smoking with vaping devices, such as e-cigarettes. Make a plan to quit smoking and commit to it. Look for programs to help you, and ask your health care provider for recommendations and ideas. Set a date and write down all the reasons you want to quit. Let your friends and family know you are quitting so they can help and support you. Consider finding friends who also want to quit. It can be easier to quit with someone else, so that you can support each other. Talk with your health care provider about using nicotine replacement medicines to help you quit. These include gum, lozenges, patches, sprays, or pills. If you try to quit but return to smoking, stay positive. It is common to slip up when you first quit, so take it one day at a time. Be prepared for cravings. When you feel the urge to smoke, chew gum or suck on hard candy. Lifestyle Stay busy. Take care of your body. Get plenty of exercise, eat a healthy diet, and drink plenty of water. Find ways to manage your stress, such as meditation, yoga, exercise, or time spent with friends and family. Ask your health care provider about having regular tests (screenings) to check for cancer. This may include blood tests, imaging tests, and other tests. Where to find support To get support to quit smoking, consider: Asking your health care provider for more information and resources. Joining a support group for people who want to quit smoking in your local community. There are many effective programs that may help you to quit. Calling the smokefree.gov counselor   helpline at 1-800-QUIT-NOW (1-800-784-8669). Where to find more information You may find more information about quitting smoking from: Centers for Disease Control and Prevention: cdc.gov/tobacco Smokefree.gov: smokefree.gov American Lung Association:  freedomfromsmoking.org Contact a health care provider if: You have problems breathing. Your lips, nose, or fingers turn blue. You have chest pain. You are coughing up blood. You feel like you will faint. You have other health changes that cause you to worry. Summary Smoking tobacco can negatively affect your health, the health of those around you, your finances, and your social life. Do not start smoking. Quit if you already smoke. If you need help quitting, ask your health care provider. Consider joining a support group for people in your local community who want to quit smoking. There are many effective programs that may help you to quit. This information is not intended to replace advice given to you by your health care provider. Make sure you discuss any questions you have with your health care provider. Document Revised: 11/12/2021 Document Reviewed: 11/12/2021 Elsevier Patient Education  2023 Elsevier Inc.  
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
## 2022-11-05 NOTE — Congregational Nurse Program (Signed)
  Dept: [phone]   Congregational Nurse Program Note  Date of Encounter: 11/05/2022 Client to [facility] for assistance with appointment at [facility]. He has abscesses under his left arm, these have reoccured over the last few months. He has also had a large abscess on his buttocks. They drain and appear to resolve, but return in other locations. RN made an apt at [facility] for today at 3:30. Client aware and plans to attend apt.  Past Medical History: Past Medical History:  Diagnosis Date   Inguinal hernia    Pancreatitis     Encounter Details:  CNP Questionnaire - 11/05/22 1056       Questionnaire   Ask client: Do you give verbal consent for me to treat you today? Yes    Student Assistance N/A    Location Patient Served  [facility]    Visit Setting with Client Organization    Patient Status Unhoused    Insurance Uninsured (Orange Card/Care Connects/Self-Pay/Medicaid Family Planning)    Insurance/Financial Assistance Referral N/A    Medication N/A    Medical Provider Yes   [facility]   Screening Referrals Made N/A    Medical Referrals Made N/A    Medical Appointment Made Non-[facility] PCP/clinic   apt made at [facility] for today 12/6 at 3:30   Recently w/o PCP, now 1st time PCP visit completed due to CNs referral or appointment made N/A    Food Have Food Insecurities   has received his new food stamp card.   Transportation N/A    Housing/Utilities No permanent housing    Economist N/A    Interventions Advocate/Support;Case Management    Abnormal to Normal Screening Since Last CN Visit N/A    Screenings CN Performed N/A    Sent Client to Lab for: N/A    Did client attend any of the following based off CNs referral or appointments made? N/A    ED Visit Averted N/A    Life-Saving Intervention Made N/A

## 2022-11-06 NOTE — Congregational Nurse Program (Signed)
  Dept: [phone]   Congregational Nurse Program Note  Date of Encounter: 11/06/2022 Client [facility] as a follow for yesterdays visit at the [facility]. Client was prescribed doxycycline for boils/skin abscesses to his right and left arm pits. RN was able to pick up medication at the [facility] pharmacy (Medication management). Medication given to client with education to complete the course as ordered. Client voiced understanding. Client has a f/u apt at [facility] on 12/13. At this time he plans to attend this apt. No other needs at this time. Past Medical History: Past Medical History:  Diagnosis Date   Inguinal hernia    Pancreatitis     Encounter Details:  CNP Questionnaire - 11/06/22 0945       Questionnaire   Ask client: Do you give verbal consent for me to treat you today? Yes    Student Assistance N/A    Location Patient Served  [facility]    Visit Setting with Client Organization    Patient Status Unhoused    Insurance Uninsured (Orange Card/Care Connects/Self-Pay/Medicaid Family Planning)    Insurance/Financial Assistance Referral N/A    Medication N/A    Medical Provider Yes   [facility]   Screening Referrals Made N/A    Medical Referrals Made N/A    Medical Appointment Made N/A   apt made at [facility] for today 12/6 at 3:30   Recently w/o PCP, now 1st time PCP visit completed due to CNs referral or appointment made N/A   client has had several apts at [facility], did attend the most recent on 11/05/22   Food Have Food Insecurities   has received his new food stamp card. 281.00/month   Transportation N/A   use the [employer] bus system   Housing/Utilities No permanent housing   currently living [place] the woods   Interpersonal Safety N/A    Interventions Advocate/Support;Case Management    Abnormal to Normal Screening Since Last CN Visit N/A    Screenings CN Performed N/A    Sent Client to Lab for: N/A    Did client attend any  of the following based off CNs referral or appointments made? Medical   cleitndid attend the apt at [facility] on 12/6   ED Visit Averted N/A    Life-Saving Intervention Made N/A

## 2022-11-10 ENCOUNTER — Other Ambulatory Visit: Payer: Self-pay

## 2022-11-12 ENCOUNTER — Ambulatory Visit: Payer: Self-pay | Admitting: Gerontology

## 2022-12-18 ENCOUNTER — Other Ambulatory Visit: Payer: Self-pay

## 2023-03-10 ENCOUNTER — Ambulatory Visit: Payer: Self-pay | Admitting: Gerontology

## 2023-03-17 NOTE — Congregational Nurse Program (Signed)
  Dept: [phone]   Congregational Nurse Program Note  Date of Encounter: 03/17/2023 Client to [facility] day center with request for assistance in applying for Medicaid. Medicaid application completed, RN to drop off at [facility]. Jeffrey Wilkins  BNS, RN Past Medical History: Past Medical History:  Diagnosis Date   Inguinal hernia    Pancreatitis     Encounter Details:  CNP Questionnaire - 03/17/23 1256       Questionnaire   Ask client: Do you give verbal consent for me to treat you today? Yes    Student Assistance N/A    Location Patient Served  [facility]    Visit Setting with Client Organization    Patient Status Unhoused    Insurance Uninsured (Orange Card/Care Connects/Self-Pay/Medicaid Family Planning)    Insurance/Financial Assistance Referral N/A   Medicaid application completed   Medication N/A    Medical Provider Yes   [facility]   Screening Referrals Made N/A    Medical Referrals Made N/A    Medical Appointment Made N/A   apt made at [facility] for today 12/6 at 3:30   Recently w/o PCP, now 1st time PCP visit completed due to CNs referral or appointment made N/A   client has had several apts at [facility], did attend the most recent on 11/05/22   Food Have Food Insecurities   has received his new food stamp card. 281.00/month   Transportation N/A   use the [employer] bus system   Housing/Utilities No permanent housing   currently living [place] a [place] the woods   Interpersonal Safety N/A    Interventions Case Management   Medicaid application completed   Abnormal to Normal Screening Since Last CN Visit N/A    Screenings CN Performed N/A    Sent Client to Lab for: N/A    Did client attend any of the following based off CNs referral or appointments made? N/A    ED Visit Averted N/A    Life-Saving Intervention Made N/A

## 2023-03-31 NOTE — Congregational Nurse Program (Signed)
  Dept: [phone]   Congregational Nurse Program Note  Date of Encounter: 03/31/2023 Late entry for encounter 03/30/23 @ 2 pm. Client seen at nurse only clinic at food [facility] requesting BP check. States he typically is followed at [facility] and worked with nurse last week to apply for [employer]. Awaiting eligibilty. BP 142/82; P 98. Co re reading and basic ways to manage. Continue follow up with PCP and [facility]. Rhermann, RN Past Medical History: Past Medical History:  Diagnosis Date   Inguinal hernia    Pancreatitis     Encounter Details:  CNP Questionnaire - 03/30/23 1400       Questionnaire   Ask client: Do you give verbal consent for me to treat you today? Yes    Student Assistance N/A    Location Patient Information systems manager, [employer]    Visit Setting with Client Organization    Patient Status Unhoused    Insurance Uninsured (Orange Card/Care Connects/Self-Pay/Medicaid Family Planning)    Insurance/Financial Assistance Referral N/A   Medicaid application completed   Medication N/A    Medical Provider Yes   [facility]   Screening Referrals Made N/A    Medical Referrals Made N/A    Medical Appointment Made N/A   apt made at [facility] for today 12/6 at 3:30   Recently w/o PCP, now 1st time PCP visit completed due to CNs referral or appointment made N/A   client has had several apts at [facility], did attend the most recent on 11/05/22   Food Have Food Insecurities   has received his new food stamp card. 281.00/month   Transportation N/A   use the [employer] bus system   Housing/Utilities No permanent housing   currently living in a tent in the woods   Interpersonal Safety N/A    Interventions Educate   Medicaid application completed   Abnormal to Normal Screening Since Last CN Visit N/A    Screenings CN Performed N/A    Sent Client to Lab for: N/A    Did client attend any of the following based off CNs referral or appointments made? N/A    ED Visit Averted N/A     Life-Saving Intervention Made N/A

## 2023-04-14 NOTE — Congregational Nurse Program (Signed)
  Dept: [phone]   Congregational Nurse Program Note  Date of Encounter: 04/14/2023 Client now  has been approved for Medicaid, [employer]. To [facility] day center with request for assistance with a primary care physician. RN was able to make a new patient apt at [facility] for 5/23 at 1pm. RN also contacted client's insurance company to update the PCP, new card to be sent to client with in 14 days with correct PCP office listed on it. No other needs at this time. [name] BSN, RN Past Medical History: Past Medical History:  Diagnosis Date   Inguinal hernia    Pancreatitis     Encounter Details:  CNP Questionnaire - 04/14/23 1333       Questionnaire   Ask client: Do you give verbal consent for me to treat you today? Yes    Student Assistance N/A    Location Patient Served  [facility]    Visit Setting with Client Organization    Patient Status Unhoused    Insurance Medicaid   client has [employer]   Insurance/Financial Assistance Referral N/A   Medicaid application completed   Medication N/A    Medical Provider No    Screening Referrals Made N/A    Medical Referrals Made [facility] PCP[facility]Clinic    Medical Appointment Made [facility] PCP[facility]clinic   New patient apt made at [facility] for 5/23 at 1 pm   Recently w/o PCP, now 1st time PCP visit completed due to CNs referral or appointment made N/A   new patient apt on 5/23   Food Have Food Insecurities   has received his new food stamp card. 281.00/month   Transportation N/A   use the [employer] bus system, RN will provide [employer] bus pass for Md apts   Housing/Utilities No permanent housing   currently living in a tent in the [place]   Interpersonal Safety N/A    Interventions Educate;Advocate/Support;Navigate Healthcare System    Abnormal to Normal Screening Since Last CN Visit N/A    Screenings CN Performed N/A    Sent Client to Lab for: N/A    Did client attend any of the following based off CNs referral or  appointments made? N/A    ED Visit Averted N/A    Life-Saving Intervention Made N/A

## 2023-04-22 NOTE — Congregational Nurse Program (Signed)
  Dept: [phone]   Congregational Nurse Program Note  Date of Encounter: 04/22/2023 Client to [facility] day center for bus pass for Md apt tomorrow. [employer] bus passes given for round trip. Apt is 5/23 at 1 pm, client has written information and directions to [facility], phone number and address as well as time of appointment. Madolyn Frieze Past Medical History: Past Medical History:  Diagnosis Date   Inguinal hernia    Pancreatitis     Encounter Details:  CNP Questionnaire - 04/22/23 1229       Questionnaire   Ask client: Do you give verbal consent for me to treat you today? Yes    Student Assistance N/A    Location Patient Served  [facility]    Visit Setting with Client Organization    Patient Status Unhoused    Insurance Medicaid   client has [employer]   Insurance/Financial Assistance Referral N/A   Medicaid application completed   Medication N/A    Medical Provider No    Screening Referrals Made N/A    Medical Referrals Made N/A    Medical Appointment Made [facility] PCP/clinic   New patient apt made at [facility] for 5/23 at 1 pm   Recently w/o PCP, now 1st time PCP visit completed due to CNs referral or appointment made N/A   new patient apt on 5/23   Food Have Food Insecurities   has received his new food stamp card. 281.00/month   Transportation N/A   use the [employer] bus system, RN will provide [employer] bus pass for Md apts   Housing/Utilities No permanent housing   currently living in a tent in the woods   Interpersonal Safety N/A    Interventions Advocate/Support    Abnormal to Normal Screening Since Last CN Visit N/A    Screenings CN Performed N/A    Sent Client to Lab for: N/A    Did client attend any of the following based off CNs referral or appointments made? N/A    ED Visit Averted N/A    Life-Saving Intervention Made N/A

## 2023-04-23 ENCOUNTER — Ambulatory Visit: Payer: Medicaid Other | Admitting: Nurse Practitioner

## 2023-04-28 NOTE — Congregational Nurse Program (Signed)
  Dept: [phone]   Congregational Nurse Program Note  Date of Encounter: 04/28/2023 Client to [facility] day center with complaints of possible poison ivy on his left upper fore arm. Anti itch cream applied. Client reports he has benadryl pills. Client also reports he did not attend his new patient appointment at [facility] on 5/23. H stated he rescheduled the appointment for 6/1. Rn has not verified this with [facility]. Client has not been given more bus passes at this time. [name] BSN, RN Past Medical History: Past Medical History:  Diagnosis Date   Inguinal hernia    Pancreatitis     Encounter Details:  CNP Questionnaire - 04/28/23 1030       Questionnaire   Ask client: Do you give verbal consent for me to treat you today? Yes    Student Assistance N/A    Location Patient Served  [facility]    Visit Setting with Client Organization    Patient Status Unhoused    Insurance Medicaid   client has [employer]   Insurance/Financial Assistance Referral N/A   Medicaid application completed   Medication N/A    Medical Provider No   client had a new patient apt at [facility] on 5/23, but did not attend   Screening Referrals Made N/A    Medical Referrals Made N/A    Medical Appointment Made N/A   New patient apt made at [facility] for 5/23 at 1 pm. Client did not attend this appointment   Recently w/o PCP, now 1st time PCP visit completed due to CNs referral or appointment made N/A   new patient apt on 5/23   Food Have Food Insecurities   has received his new food stamp card. 281.00/month and also gets food regularly a the [employer] N/A   Bus passes provided for apt on 5/23, client did not attend this appointment   Housing/Utilities No permanent housing   currently living [place] a [place] the woods   Interpersonal Safety N/A    Interventions Advocate/Support    Abnormal to Normal Screening Since Last CN  Visit N/A    Screenings CN Performed N/A    Sent Client to Lab for: N/A    Did client attend any of the following based off CNs referral or appointments made? N/A    ED Visit Averted N/A    Life-Saving Intervention Made N/A

## 2023-04-30 NOTE — Congregational Nurse Program (Signed)
  Dept: [phone]   Congregational Nurse Program Note  Date of Encounter: 04/30/2023 Client to [facility] with complaints of groin chafing. He walks a lot during the day. He wears a hernia belt and this has caused chafing. RN gave some medicated body powder to help reduce the  friction. No other needs at this time. [name] BSN, RN Past Medical History: Past Medical History:  Diagnosis Date   Inguinal hernia    Pancreatitis     Encounter Details:  CNP Questionnaire - 04/30/23 1200       Questionnaire   Ask client: Do you give verbal consent for me to treat you today? Yes    Student Assistance N/A    Location Patient Served  [facility]    Visit Setting with Client Organization    Patient Status Unhoused    Insurance Medicaid   client has [employer]   Insurance/Financial Assistance Referral N/A   Medicaid application completed   Medication N/A    Medical Provider No   client had a new patient apt at [facility] on 5/23, but did not attend   Screening Referrals Made N/A    Medical Referrals Made N/A    Medical Appointment Made N/A   New patient apt made at [facility] for 5/23 at 1 pm. Client did not attend this appointment   Recently w/o PCP, now 1st time PCP visit completed due to CNs referral or appointment made N/A   new patient apt on 5/23   Food Have Food Insecurities   has received his new food stamp card. 281.00/month and also gets food regularly a the [employer] N/A   Bus passes provided for apt on 5/23, client did not attend this appointment   Housing/Utilities No permanent housing   currently living in a tent [place] the woods   Interpersonal Safety N/A    Interventions Advocate/Support    Abnormal to Normal Screening Since Last CN Visit N/A    Screenings CN Performed N/A    Sent Client to Lab for: N/A    Did client attend any of the following based off CNs referral or appointments made? N/A     ED Visit Averted N/A    Life-Saving Intervention Made N/A

## 2023-05-01 ENCOUNTER — Ambulatory Visit: Payer: Medicaid Other | Admitting: Nurse Practitioner

## 2023-05-13 NOTE — Congregational Nurse Program (Signed)
  Dept: [phone]   Congregational Nurse Program Note  Date of Encounter: 05/13/2023 Client to [facility] day center with request for foot care. Toe nails trimmed. Nail fungus noted.  Client has rescheduled his new patient apt at [facility] for 6/20 at 2:30 pm. RN advised he could ask the provider about medications for his nail fungus or request a podiatry referral at that apt. No other needs at this time. [name] BSN, RN Past Medical History: Past Medical History:  Diagnosis Date   Inguinal hernia    Pancreatitis     Encounter Details:  CNP Questionnaire - 05/13/23 1220       Questionnaire   Ask client: Do you give verbal consent for me to treat you today? Yes    Student Assistance N/A    Location Patient Served  [facility]    Visit Setting with Client Organization    Patient Status Unhoused    Insurance Medicaid   client has [employer]   Insurance/Financial Assistance Referral N/A   Medicaid application completed   Medication N/A    Medical Provider No   client had a new patient apt at [facility] on 5/23, but did not attend. Client rescheduled this apt for 6/20 at 2:30 pm   Screening Referrals Made N/A    Medical Referrals Made N/A    Medical Appointment Made N/A   New patient apt made at [facility] for 5/23 at 1 pm. Client did not attend this appointment. Client rescheduled for 6/20 at 2:30 pm   Recently w/o PCP, now 1st time PCP visit completed due to CNs referral or appointment made N/A   new patient apt on 5/23   Food N/A   has received his new food stamp card. 281.00/month and also gets food regularly a the [employer] N/A   [employer] passes to be provided for Md apts   Housing/Utilities No permanent housing   [place]   Interpersonal Safety N/A    Interventions Advocate/Support    Abnormal to Normal Screening Since Last CN Visit N/A    Screenings CN Performed N/A     Sent Client to Lab for: N/A    Did client attend any of the following based off CNs referral or appointments made? N/A    ED Visit Averted N/A    Life-Saving Intervention Made N/A

## 2023-05-21 ENCOUNTER — Encounter: Payer: Self-pay | Admitting: Internal Medicine

## 2023-05-21 ENCOUNTER — Ambulatory Visit (INDEPENDENT_AMBULATORY_CARE_PROVIDER_SITE_OTHER): Payer: Medicaid Other | Admitting: Internal Medicine

## 2023-05-21 VITALS — BP 132/80 | HR 71 | Ht 68.0 in | Wt 127.2 lb

## 2023-05-21 DIAGNOSIS — I10 Essential (primary) hypertension: Secondary | ICD-10-CM | POA: Diagnosis not present

## 2023-05-21 DIAGNOSIS — K4021 Bilateral inguinal hernia, without obstruction or gangrene, recurrent: Secondary | ICD-10-CM | POA: Insufficient documentation

## 2023-05-21 DIAGNOSIS — Z125 Encounter for screening for malignant neoplasm of prostate: Secondary | ICD-10-CM

## 2023-05-21 DIAGNOSIS — Z8249 Family history of ischemic heart disease and other diseases of the circulatory system: Secondary | ICD-10-CM | POA: Insufficient documentation

## 2023-05-21 DIAGNOSIS — F17200 Nicotine dependence, unspecified, uncomplicated: Secondary | ICD-10-CM | POA: Diagnosis not present

## 2023-05-21 DIAGNOSIS — Z7689 Persons encountering health services in other specified circumstances: Secondary | ICD-10-CM | POA: Diagnosis not present

## 2023-05-21 DIAGNOSIS — Z87898 Personal history of other specified conditions: Secondary | ICD-10-CM | POA: Insufficient documentation

## 2023-05-21 DIAGNOSIS — R634 Abnormal weight loss: Secondary | ICD-10-CM | POA: Insufficient documentation

## 2023-05-21 HISTORY — DX: Essential (primary) hypertension: I10

## 2023-05-21 LAB — POCT URINALYSIS DIPSTICK
Bilirubin, UA: NEGATIVE
Glucose, UA: NEGATIVE
Ketones, UA: NEGATIVE
Leukocytes, UA: NEGATIVE
Nitrite, UA: NEGATIVE
Protein, UA: NEGATIVE
Spec Grav, UA: >=1.03 — AB (ref 1.010–1.025)
Urobilinogen, UA: 0.2 E.U./dL
pH, UA: 5.5 (ref 5.0–8.0)

## 2023-05-21 MED ORDER — AMLODIPINE BESYLATE 5 MG PO TABS: 5.0000 mg | ORAL_TABLET | Freq: Every day | ORAL | 11 refills | Status: DC
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
## 2023-05-22 LAB — CBC WITH DIFFERENTIAL
Basophils Absolute: 0.1 10*3/uL (ref 0.0–0.2)
Basos: 1 %
EOS (ABSOLUTE): 0.2 10*3/uL (ref 0.0–0.4)
Eos: 3 %
Hematocrit: 44.6 % (ref 37.5–51.0)
Hemoglobin: 15.6 g/dL (ref 13.0–17.7)
Immature Grans (Abs): 0 10*3/uL (ref 0.0–0.1)
Immature Granulocytes: 0 %
Lymphocytes Absolute: 2.5 10*3/uL (ref 0.7–3.1)
Lymphs: 29 %
MCH: 32.9 pg (ref 26.6–33.0)
MCHC: 35 g/dL (ref 31.5–35.7)
MCV: 94 fL (ref 79–97)
Monocytes Absolute: 0.6 10*3/uL (ref 0.1–0.9)
Monocytes: 7 %
Neutrophils Absolute: 5.1 10*3/uL (ref 1.4–7.0)
Neutrophils: 60 %
RBC: 4.74 x10E6/uL (ref 4.14–5.80)
RDW: 12.1 % (ref 11.6–15.4)
WBC: 8.4 10*3/uL (ref 3.4–10.8)

## 2023-05-22 LAB — HEMOGLOBIN A1C
Est. average glucose Bld gHb Est-mCnc: 128 mg/dL
Hgb A1c MFr Bld: 6.1 % — ABNORMAL HIGH (ref 4.8–5.6)

## 2023-05-22 LAB — CMP14+EGFR
ALT: 16 IU/L (ref 0–44)
AST: 20 IU/L (ref 0–40)
Albumin: 4.3 g/dL (ref 3.8–4.9)
Alkaline Phosphatase: 97 IU/L (ref 44–121)
BUN/Creatinine Ratio: 30 — ABNORMAL HIGH (ref 9–20)
BUN: 21 mg/dL (ref 6–24)
Bilirubin Total: 0.2 mg/dL (ref 0.0–1.2)
CO2: 23 mmol/L (ref 20–29)
Calcium: 9.8 mg/dL (ref 8.7–10.2)
Chloride: 104 mmol/L (ref 96–106)
Creatinine, Ser: 0.7 mg/dL — ABNORMAL LOW (ref 0.76–1.27)
Globulin, Total: 2.5 g/dL (ref 1.5–4.5)
Glucose: 74 mg/dL (ref 70–99)
Potassium: 4.9 mmol/L (ref 3.5–5.2)
Sodium: 140 mmol/L (ref 134–144)
Total Protein: 6.8 g/dL (ref 6.0–8.5)
eGFR: 107 mL/min/{1.73_m2} (ref >59)

## 2023-05-22 LAB — LIPID PANEL
Chol/HDL Ratio: 4.5 ratio (ref 0.0–5.0)
Cholesterol, Total: 184 mg/dL (ref 100–199)
HDL: 41 mg/dL (ref >39)
LDL Chol Calc (NIH): 88 mg/dL (ref 0–99)
Triglycerides: 337 mg/dL — ABNORMAL HIGH (ref 0–149)
VLDL Cholesterol Cal: 55 mg/dL — ABNORMAL HIGH (ref 5–40)

## 2023-05-22 LAB — PSA: Prostate Specific Ag, Serum: 1.4 ng/mL (ref 0.0–4.0)

## 2023-05-22 LAB — TSH: TSH: 1.55 u[IU]/mL (ref 0.450–4.500)

## 2023-05-26 NOTE — Congregational Nurse Program (Signed)
  Dept: [phone]   Congregational Nurse Program Note  Date of Encounter: 05/26/2023 Client to [facility] day center as follow to his new patient apt at [facility] on 6/20. He reports he was started on a BP medication. Chart notes reviewed. Client was prescribed amlodipine. He reports he was unable to pay the 4.00 co-pay for his medication. RN contacted [facility] on [address] Per the pharmacy rep RN spoke with, client just needs to tell the pharmacy that he does not have the co-pay, he will be given the medication with out it. Information given to client. [employer] bus pass given for trip to pick up medication. [name] BSN, RN Past Medical History: Past Medical History:  Diagnosis Date   Essential hypertension, benign 05/21/2023   Inguinal hernia    Pancreatitis     Encounter Details:  CNP Questionnaire - 05/26/23 1301       Questionnaire   Ask client: Do you give verbal consent for me to treat you today? Yes    Student Assistance N/A    Location Patient Served  [facility]    Visit Setting with Client Organization    Patient Status Unhoused    Insurance Medicaid   client has [employer]   Insurance/Financial Assistance Referral N/A   Medicaid application completed   Medication Have Medication Insecurities   client unable to pay his 4.00 copay   Medical Provider Yes   client had a new patient apt at [facility] on 5/23, but did not attend. Client rescheduled this apt for 6/20 at 2:30 pm. client did attend this apt   Screening Referrals Made N/A    Medical Referrals Made N/A    Medical Appointment Made N/A   New patient apt made at [facility] for 5/23 at 1 pm. Client did not attend this appointment. Client rescheduled for 6/20 at 2:30 pm   Recently w/o PCP, now 1st time PCP visit completed due to CNs referral or appointment made Yes   client did attend his new patient apt at [facility] on 6/20   Food N/A   has received his new food  stamp card. 281.00/month and also gets food regularly a the [employer] N/A   [employer] passes to be provided for Md apts   Housing/Utilities No permanent housing   currently living in a tent in the [place]   Interpersonal Safety N/A    Interventions Advocate/Support;Navigate Healthcare System    Abnormal to Normal Screening Since Last CN Visit N/A    Screenings CN Performed N/A    Sent Client to Lab for: N/A    Did client attend any of the following based off CNs referral or appointments made? N/A    ED Visit Averted N/A    Life-Saving Intervention Made N/A

## 2023-06-08 ENCOUNTER — Ambulatory Visit: Payer: Medicaid Other | Admitting: Internal Medicine

## 2023-06-09 ENCOUNTER — Other Ambulatory Visit: Payer: Medicaid Other

## 2023-07-02 NOTE — Congregational Nurse Program (Signed)
  Dept: [phone]   Congregational Nurse Program Note  Date of Encounter: 07/02/2023 Client to [facility] day center with request for assistance with a letter he received as a result of a referral made to [facility]. Client reports he has picked up the amlodipine that was ordered at his new patient apt last month and has taken it for the last several days. BP 114/78 today. He is taking it in the morning and has not had any side effects. Client agreed to have RN check his blood pressure regularly to monitor the effects of the new medication. Client left the center before making an apt at [facility]. RN to follow up with client next week. [name] BSN, RN Past Medical History: Past Medical History:  Diagnosis Date   Essential hypertension, benign 05/21/2023   Inguinal hernia    Pancreatitis     Encounter Details:  CNP Questionnaire - 07/02/23 1136       Questionnaire   Ask client: Do you give verbal consent for me to treat you today? Yes    Student Assistance N/A    Location Patient Served  [facility]    Visit Setting with Client Organization    Patient Status Unhoused    Insurance Medicaid   client has [employer]   Insurance/Financial Assistance Referral N/A   Medicaid application completed   Medication N/A   client unable to pay his 4.00 copay   Medical Provider Yes   client had a new patient apt at [facility] on 5/23, but did not attend. Client rescheduled this apt for 6/20 at 2:30 pm. client did attend this apt   Screening Referrals Made N/A    Medical Referrals Made N/A    Medical Appointment Made N/A    Recently w/o PCP, now 1st time PCP visit completed due to CNs referral or appointment made N/A   client did attend his new patient apt at [facility] on 6/20   Food N/A   has received his new food stamp card. 281.00/month and also gets food regularly a the [employer] N/A   [employer] passes  to be provided for Md apts client now has Medicaid transportation   Housing/Utilities No permanent housing   currently living in a tent in the [place]   Interpersonal Safety N/A    Interventions Advocate/Support;Navigate Healthcare System    Abnormal to Normal Screening Since Last CN Visit N/A    Screenings CN Performed Blood Pressure;Pulse Ox    Sent Client to Lab for: N/A    Did client attend any of the following based off CNs referral or appointments made? N/A    ED Visit Averted N/A    Life-Saving Intervention Made N/A

## 2023-07-08 NOTE — Congregational Nurse Program (Signed)
  Dept: [phone]   Congregational Nurse Program Note  Date of Encounter: 07/08/2023 Client to [facility] day center with request for assistance in making an appointment at [facility] with Dr. Claudine Mouton.  Apt is 8/13 a 2:45 pm. Medicaid transportation set up for this appointment and return trip. [name] BSN, RN Past Medical History: Past Medical History:  Diagnosis Date   Essential hypertension, benign 05/21/2023   Inguinal hernia    Pancreatitis     Encounter Details:  CNP Questionnaire - 07/08/23 1215       Questionnaire   Ask client: Do you give verbal consent for me to treat you today? Yes    Student Assistance N/A    Location Patient Served  [facility]    Visit Setting with Client Organization    Patient Status Unhoused    Insurance Medicaid   client has [employer]   Insurance/Financial Assistance Referral N/A   Medicaid application completed   Medication N/A   client unable to pay his 4.00 copay   Medical Provider Yes   client had a new patient apt at [facility] on 5/23, but did not attend. Client rescheduled this apt for 6/20 at 2:30 pm. client did attend this apt   Screening Referrals Made N/A    Medical Referrals Made N/A    Medical Appointment Made [facility] PCP[facility]clinic   apt made at [facility] for 8/13 at 2:45   Recently w/o PCP, now 1st time PCP visit completed due to CNs referral or appointment made N/A   client did attend his new patient apt at [facility] on 6/20   Food N/A   has received his new food stamp card. 281.00/month and also gets food regularly a the [employer] Provided transportation assistance   Medicaid transportation arranged for MD apt on 8/13.   Housing/Utilities No permanent housing   currently living in a tent in the woods   Interpersonal Safety N/A    Interventions Advocate/Support;Case Management    Abnormal to Normal Screening Since Last CN Visit N/A     Screenings CN Performed N/A    Sent Client to Lab for: N/A    Did client attend any of the following based off CNs referral or appointments made? N/A    ED Visit Averted N/A    Life-Saving Intervention Made N/A
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
## 2023-07-14 ENCOUNTER — Ambulatory Visit: Payer: Medicaid Other | Admitting: Surgery

## 2023-07-21 ENCOUNTER — Ambulatory Visit: Payer: Medicaid Other | Admitting: Surgery

## 2023-07-27 NOTE — Congregational Nurse Program (Signed)
  Dept: [phone]   Congregational Nurse Program Note  Date of Encounter: 07/27/2023 Client to [facility] day center with complaints of "feeling really bad" for the last 3 days. He reports he has had a cough, fever and some congestion. Mask given. He has not sought medical attention. He also reports some soreness in his upper right chest/ribs. Lungs clear. Client reports he has been taking acetaminophen and that his fever 'broke" this morning. No fever at this visit. RN advised client to hydrate with water and rest. He reports he has been drinking water frequently. Bottled water given out today for all participants. Client advised not to come back to the center until all of his symptoms improved. Client also advised to seek medical attention if he had any increased shortness of breath. [name] BSN, RN Past Medical History: Past Medical History:  Diagnosis Date   Essential hypertension, benign 05/21/2023   Inguinal hernia    Pancreatitis     Encounter Details:  CNP Questionnaire - 07/27/23 1215       Questionnaire   Ask client: Do you give verbal consent for me to treat you today? Yes    Student Assistance N/A    Location Patient Served  [facility]    Visit Setting with Client Organization    Patient Status Unhoused    Insurance Medicaid   client has [employer]   Insurance/Financial Assistance Referral N/A   Medicaid application completed   Medication N/A   client unable to pay his 4.00 copay   Medical Provider Yes   client had a new patient apt at [facility] on 5/23, but did not attend. Client rescheduled this apt for 6/20 at 2:30 pm. client did attend this apt   Screening Referrals Made N/A    Medical Referrals Made N/A    Medical Appointment Made N/A   apt made at [facility] for 8/13 at 2:45, client did not attend this apt   Recently w/o PCP, now 1st time PCP visit completed due to CNs referral or appointment made N/A   client did attend his  new patient apt at [facility] on 6/20   Food N/A   has received his new food stamp card. 281.00/month and also gets food regularly a the [employer] N/A   Medicaid transportation arranged for MD apt on 8/13.   Housing/Utilities No permanent housing   currently living in a tent in the woods   Interpersonal Safety N/A    Interventions Advocate/Support;Case Management    Abnormal to Normal Screening Since Last CN Visit N/A    Screenings CN Performed Blood Pressure;Pulse Ox    Sent Client to Lab for: N/A    Did client attend any of the following based off CNs referral or appointments made? N/A    ED Visit Averted N/A    Life-Saving Intervention Made N/A

## 2023-08-11 NOTE — Congregational Nurse Program (Signed)
  Dept: [phone]   Congregational Nurse Program Note  Date of Encounter: 08/11/2023 Client to [facility] day center with nonproductive cough and shortness of breath. Client has been ill for the past 3 weeks, and has declined to be seen by his PCP or Urgent care. Today at the center he was noticeably more short of breath with any exertion. Oxygen saturations 89% on room air. Wheezes noted on auscultation in the upper right lung field. Client was not well enough to ride the [place] bus. [place] again declined to go to the [facility] ER.Transportation arranged to Urgent care on [address] K [place] Campion Past Medical History: Past Medical History:  Diagnosis Date   Essential hypertension, benign 05/21/2023   Inguinal hernia    Pancreatitis     Encounter Details:  CNP Questionnaire - 08/11/23 1130       Questionnaire   Ask client: Do you give verbal consent for me to treat you today? Yes    Student Assistance N/A    Location Patient Served  [facility]    Visit Setting with Client Organization    Patient Status Unhoused    Insurance Medicaid   client has [employer]   Insurance/Financial Assistance Referral N/A   Medicaid application completed   Medication N/A   not currently taking medications   Medical Provider Yes   [facility]   Screening Referrals Made N/A    Medical Referrals Made Urgent Care    Medical Appointment Made N/A    Recently w/o PCP, now 1st time PCP visit completed due to CNs referral or appointment made N/A   client did attend his new patient apt at [facility] on 6/20   Food N/A   has received his new food stamp card. 281.00/month and also gets food regularly a the [employer] Provided transportation assistance   Ride to urgent care today provided through the [employer] nurse program   Housing/Utilities No permanent housing   currently living in a tent in the woods   Interpersonal Safety N/A     Interventions Advocate/Support;Educate    Abnormal to Normal Screening Since Last CN Visit N/A    Screenings CN Performed Pulse Ox   pulse ox 89%   Sent Client to Lab for: N/A    Did client attend any of the following based off CNs referral or appointments made? N/A    ED Visit Averted N/A   client sent to Urgent care   Life-Saving Intervention Made N/A

## 2023-08-12 NOTE — Congregational Nurse Program (Signed)
  Dept: [phone]   Congregational Nurse Program Note  Date of Encounter: 08/12/2023 Client to [facility] day center for follow up visit after being seen at Urgent Care on [address] yesterday. Client was prescribed and antibiotic, prednisone taper and an inhaler. He reports he was also given a nebulizer treatment at the Urgent care. Client did bring those medications. RN was able to copy a calendar and make a chart for him to be sure he takes the medication correctly. He was diagnosed with acute Bronchitis. Lung sounds much improved over visit yesterday. Client was instructed to finish the medications as prescribed. Client voiced understanding. Fooot care also provided. BP 136/84 (BP Location: Left Arm, Patient Position: Sitting, Cuff Size: Normal)   Pulse 99   SpO2 92%   Past Medical History: Past Medical History:  Diagnosis Date   Essential hypertension, benign 05/21/2023   Inguinal hernia    Pancreatitis     Encounter Details:  CNP Questionnaire - 08/12/23 1100       Questionnaire   Ask client: Do you give verbal consent for me to treat you today? Yes    Student Assistance N/A    Location Patient Served  [facility]    Visit Setting with Client Organization    Patient Status Unhoused    Insurance Medicaid   client has [employer]   Insurance/Financial Assistance Referral N/A   Medicaid application completed   Medication N/A   not currently taking medications   Medical Provider Yes   [facility]   Screening Referrals Made N/A    Medical Referrals Made N/A    Medical Appointment Made N/A    Recently w/o PCP, now 1st time PCP visit completed due to CNs referral or appointment made N/A   client did attend his new patient apt at [facility] on 6/20   Food N/A   has received his new food stamp card. 281.00/month and also gets food regularly a the [employer] N/A    Housing/Utilities No permanent housing   currently  living in a tent in the woods   Interpersonal Safety N/A    Interventions Advocate/Support;Educate;Reviewed Medications;Reviewed D/C Planning    Abnormal to Normal Screening Since Last CN Visit N/A    Screenings CN Performed Pulse Ox;Blood Pressure   pulse ox improved to 92%   Sent Client to Lab for: N/A    Did client attend any of the following based off CNs referral or appointments made? Medical   client went to Urgent care and was seen   ED Visit Averted N/A   client sent to Urgent care   Life-Saving Intervention Made N/A

## 2023-08-19 ENCOUNTER — Telehealth: Payer: Self-pay | Admitting: Nurse Practitioner

## 2023-08-19 NOTE — Congregational Nurse Program (Signed)
  Dept: [phone]   Congregational Nurse Program Note  Date of Encounter: 08/19/2023 Client to [facility] day center with complaints of persistent cough. He was seen at urgent care on 9/10 and prescribed a prednisone taper, abt and an an inhaler. He reports he has taken all medication as prescribed. RN contacted his PCP office, [facility] at his request to make an apt as follow up to the Urgent care visit. Message left, awaiting return call. RN educated client about not smoking and encouraged fluids. [name] BSN, RN Past Medical History: Past Medical History:  Diagnosis Date   Essential hypertension, benign 05/21/2023   Inguinal hernia    Pancreatitis     Encounter Details:  CNP Questionnaire - 08/19/23 1000       Questionnaire   Ask client: Do you give verbal consent for me to treat you today? Yes    Student Assistance N/A    Location Patient Served  [facility]    Visit Setting with Client Organization    Patient Status Unhoused    Insurance Medicaid   client has [employer]   Insurance/Financial Assistance Referral N/A   Medicaid application completed   Medication N/A   not currently taking medications   Medical Provider Yes   [facility]   Screening Referrals Made N/A    Medical Referrals Made Non-[facility] PCP/Clinic    Medical Appointment Made N/A   RN contacted [facility] at client request to attempt to make an appointment as follow up to UC on 9/10.   Recently w/o PCP, now 1st time PCP visit completed due to CNs referral or appointment made N/A   client did attend his new patient apt at [facility] on 6/20   Food N/A   has received his new food stamp card. 281.00/month and also gets food regularly a the [employer] N/A    Housing/Utilities No permanent housing   currently living in a tent in the woods   Interpersonal Safety N/A    Interventions Advocate/Support;Educate    Abnormal to Normal Screening  Since Last CN Visit N/A    Screenings CN Performed N/A   pulse ox improved to 92%   Sent Client to Lab for: N/A    Did client attend any of the following based off CNs referral or appointments made? N/A   client went to Urgent care and was seen on 9/10   ED Visit Averted N/A   client sent to Urgent care   Life-Saving Intervention Made N/A

## 2023-08-19 NOTE — Telephone Encounter (Signed)
Patient left VM that he was seen at [facility] on 9/10 and diagnosed with bronchitis. He is still experiencing a cough and would like [place] to send in something please.

## 2023-08-19 NOTE — Telephone Encounter (Signed)
Entered in error

## 2024-08-30 ENCOUNTER — Telehealth: Payer: Self-pay

## 2024-08-30 NOTE — Congregational Nurse Program (Signed)
  Dept: [phone]   Congregational Nurse Program Note  Date of Encounter: 08/30/2024 Client into nurse only clinic requesting assistance obtaining remaining doses of Augmentin prescribed by clinician at [facility]. Was released with 8 pills when prescription bottle says take 2 tabs bid x 10 days. Assisted with call to PCP to see if assistance. Clinician not responding before this clinic closed and explained to client he will likely need to be seen before additional RX will be provided. Set appointment Family Medicine at 1:30 10/10. Client states he has bus pass to access appointment. Plans to follow up getting referral to surgeon for double hernia repair since the dr he was referred to last year is retiring next month. Follow up prn. Rhermann, RN Past Medical History: Past Medical History:  Diagnosis Date   Essential hypertension, benign 05/21/2023   Inguinal hernia    Pancreatitis     Encounter Details:  Community Questionnaire - 08/30/24 1200       Questionnaire   Ask client: Do you give verbal consent for me to treat you today? Yes    Student Assistance N/A    Location Patient Served  [facility]    Encounter Setting CN site    Population Status Unhoused    Insurance Medicaid   client has [employer]   Insurance/Financial Assistance Referral N/A   Medicaid application completed   Medication N/A   not currently taking medications   Medical Provider Yes   [facility]   Screening Referrals Made N/A    Medical Referrals Made Non-[facility] PCP/Clinic    Medical Appointment Completed N/A    CNP Interventions Advocate/Support;Educate    Screenings CN Performed N/A   pulse ox improved to 92%   ED Visit Averted N/A   client sent to Urgent care   Life-Saving Intervention Made N/A

## 2024-08-30 NOTE — Telephone Encounter (Signed)
 Pt had an inner ear infection and was given amoxicillin. The bottle says take it BID for 10 days however the bottle only had 8 pills, 4 days worth. Asking if we can prescribe what's missing.

## 2024-08-30 NOTE — Telephone Encounter (Signed)
 Becky with [facility], states the patient just got out of prison and that's where is was prescribed for him at, he's got an appt next week from what they said

## 2024-09-08 NOTE — Congregational Nurse Program (Signed)
  Dept: [phone]   Congregational Nurse Program Note  Date of Encounter: 09/08/2024 Client into nurse only clinic at [facility] to access assistance to appt tomorrow. Medicaid Navigator verified Medicaid eligibilty was 09/06/24. Too close to appt to schedule [employer] transportation so round trip bus pass provided. Verbailzes understanding of importance to keep appt which re-establishes him as patient. Rhermann RJHermann, RN Past Medical History: Past Medical History:  Diagnosis Date   Essential hypertension, benign 05/21/2023   Inguinal hernia    Pancreatitis     Encounter Details:  Community Questionnaire - 09/08/24 1100       Questionnaire   Ask client: Do you give verbal consent for me to treat you today? Yes    Student Assistance N/A    Location Patient Served  [facility]    Encounter Setting CN site    Population Status Unhoused    Insurance Medicaid   client has [employer]   Insurance/Financial Assistance Referral N/A   Medicaid application completed   Medication N/A   not currently taking medications   Medical Provider Yes   [facility]   Screening Referrals Made N/A    Medical Referrals Made Non-[facility] PCP/Clinic    Medical Appointment Completed N/A    CNP Interventions Advocate/Support;Educate;Navigate Healthcare System    Screenings CN Performed N/A   pulse ox improved to 92%   ED Visit Averted N/A   client sent to Urgent care   Life-Saving Intervention Made N/A

## 2024-09-08 NOTE — Telephone Encounter (Signed)
 Pt comes in tomorrow

## 2024-09-09 ENCOUNTER — Ambulatory Visit: Payer: Self-pay | Admitting: Cardiology

## 2024-09-20 NOTE — Congregational Nurse Program (Signed)
  Dept: [phone]   Congregational Nurse Program Note  Date of Encounter: 09/20/2024 Client to [facility] with request for assistance in rescheduling an Md appointment he missed on 101/10. Client was uncertain as to where the appointment was. RN contacted [facility] to reschedule the appointment. Appointment given for 10/31 at 10:30 am with NP Jeffrey Wilkins. Appointment card given to client. RN to assist in arranging Medicaid transportation next week, client aware. Jeffrey Wilkins BSN, RN Past Medical History: Past Medical History:  Diagnosis Date   Essential hypertension, benign 05/21/2023   Inguinal hernia    Pancreatitis     Encounter Details:  Community Questionnaire - 09/20/24 1147       Questionnaire   Ask client: Do you give verbal consent for me to treat you today? Yes    Student Assistance N/A    Location Patient Served  [facility]    Encounter Setting CN site    Population Status Unhoused    Insurance Medicaid   client has [employer]   Insurance/Financial Assistance Referral N/A   Medicaid application completed   Medication N/A   not currently taking medications   Medical Provider Yes   [facility]   Screening Referrals Made N/A    Medical Referrals Made Non-[facility] PCP/Clinic    Medical Appointment Completed N/A    CNP Interventions Advocate/Support;Educate;Navigate Healthcare System    Screenings CN Performed N/A   pulse ox improved to 92%   ED Visit Averted N/A   client sent to Urgent care   Life-Saving Intervention Made N/A

## 2024-09-28 NOTE — Congregational Nurse Program (Signed)
  Dept: [phone]   Congregational Nurse Program Note  Date of Encounter: 09/28/2024 Client to [facility] with request for assistance in arranging medicaid transportation for his upcoming PCP apt on 10/31. RN contacted transportation for [employer], however their system was down and  RN was unable to arrange this. Representative stated that due to this complication, RN could arrange transportation tomorrow, which is only 24 hrs prior. Client aware and will return to [facility] tomorrow again for assistance. MARLA Marina BSN, RN Past Medical History: Past Medical History:  Diagnosis Date   Essential hypertension, benign 05/21/2023   Inguinal hernia    Pancreatitis     Encounter Details:  Community Questionnaire - 09/28/24 1130       Questionnaire   Ask client: Do you give verbal consent for me to treat you today? Yes    Student Assistance N/A    Location Patient Served  [facility]    Encounter Setting CN site    Population Status Unhoused    Insurance Medicaid   client has [employer]   Insurance/Financial Assistance Referral N/A   Medicaid application completed   Medication N/A   not currently taking medications   Medical Provider Yes   [facility]   Screening Referrals Made N/A    Medical Referrals Made [facility] PCP/Clinic    Medical Appointment Completed N/A    CNP Interventions Advocate/Support;Navigate Healthcare System    Screenings CN Performed N/A   pulse ox improved to 92%   ED Visit Averted N/A   client sent to Urgent care   Life-Saving Intervention Made N/A

## 2024-09-29 NOTE — Congregational Nurse Program (Signed)
  Dept: [phone]   Congregational Nurse Program Note  Date of Encounter: 09/29/2024 Client to [facility] for assistance in arranging Medicaid transportation to his PCP apt on 10/31 at 10:30. RN contacted the transportation number for [employer], but was told client was not active at this time. At some point client was told he had this insurance. Client has straight Medicaid, no managed plan in place. Client should receive a mailing from social services with a plan chosen for him. There was not enough lead time to set transportation up through [facility] [place] bus passes given. MARLA Marina BSN, RN Past Medical History: Past Medical History:  Diagnosis Date   Essential hypertension, benign 05/21/2023   Inguinal hernia    Pancreatitis     Encounter Details:  Community Questionnaire - 09/29/24 1301       Questionnaire   Ask client: Do you give verbal consent for me to treat you today? Yes    Student Assistance N/A    Location Patient Event Organiser    Encounter Setting CN site    Population Status Unhoused    Insurance Medicaid   client has [employer]   Insurance/Financial Assistance Referral N/A   Medicaid application completed   Medication N/A   not currently taking medications   Medical Provider Yes   [facility]   Screening Referrals Made N/A    Medical Referrals Made [facility] PCP/Clinic    Medical Appointment Completed N/A    CNP Interventions Advocate/Support;Navigate Healthcare System;Case Management    Screenings CN Performed N/A   pulse ox improved to 92%   ED Visit Averted N/A   client sent to Urgent care   Life-Saving Intervention Made N/A

## 2024-09-30 ENCOUNTER — Ambulatory Visit (INDEPENDENT_AMBULATORY_CARE_PROVIDER_SITE_OTHER): Admitting: Cardiology

## 2024-09-30 ENCOUNTER — Ambulatory Visit: Payer: Self-pay | Admitting: Cardiology

## 2024-09-30 ENCOUNTER — Encounter: Payer: Self-pay | Admitting: Cardiology

## 2024-09-30 VITALS — BP 120/86 | HR 89 | Ht 68.0 in | Wt 135.2 lb

## 2024-09-30 DIAGNOSIS — F17200 Nicotine dependence, unspecified, uncomplicated: Secondary | ICD-10-CM | POA: Diagnosis not present

## 2024-09-30 DIAGNOSIS — H6121 Impacted cerumen, right ear: Secondary | ICD-10-CM | POA: Diagnosis not present

## 2024-09-30 DIAGNOSIS — Z125 Encounter for screening for malignant neoplasm of prostate: Secondary | ICD-10-CM

## 2024-09-30 DIAGNOSIS — H6123 Impacted cerumen, bilateral: Secondary | ICD-10-CM | POA: Diagnosis not present

## 2024-09-30 DIAGNOSIS — E782 Mixed hyperlipidemia: Secondary | ICD-10-CM

## 2024-09-30 DIAGNOSIS — K4021 Bilateral inguinal hernia, without obstruction or gangrene, recurrent: Secondary | ICD-10-CM

## 2024-09-30 DIAGNOSIS — I10 Essential (primary) hypertension: Secondary | ICD-10-CM

## 2024-09-30 DIAGNOSIS — R7303 Prediabetes: Secondary | ICD-10-CM

## 2024-09-30 DIAGNOSIS — Z1329 Encounter for screening for other suspected endocrine disorder: Secondary | ICD-10-CM

## 2024-09-30 MED ORDER — ALBUTEROL SULFATE HFA 108 (90 BASE) MCG/ACT IN AERS: 2.0000 | INHALATION_SPRAY | Freq: Four times a day (QID) | RESPIRATORY_TRACT | 4 refills | Status: AC | PRN

## 2024-09-30 MED ORDER — AMLODIPINE BESYLATE 5 MG PO TABS: 5.0000 mg | ORAL_TABLET | Freq: Every day | ORAL | 11 refills | Status: AC
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
## 2024-10-01 LAB — LIPID PANEL
Chol/HDL Ratio: 3 ratio (ref 0.0–5.0)
Cholesterol, Total: 164 mg/dL (ref 100–199)
HDL: 55 mg/dL (ref >39)
LDL Chol Calc (NIH): 95 mg/dL (ref 0–99)
Triglycerides: 76 mg/dL (ref 0–149)
VLDL Cholesterol Cal: 14 mg/dL (ref 5–40)

## 2024-10-01 LAB — PSA: Prostate Specific Ag, Serum: 2.8 ng/mL (ref 0.0–4.0)

## 2024-10-01 LAB — CMP14+EGFR
ALT: 17 IU/L (ref 0–44)
AST: 23 IU/L (ref 0–40)
Albumin: 4.6 g/dL (ref 3.8–4.9)
Alkaline Phosphatase: 109 IU/L (ref 47–123)
BUN/Creatinine Ratio: 22 — ABNORMAL HIGH (ref 9–20)
BUN: 22 mg/dL (ref 6–24)
Bilirubin Total: 0.3 mg/dL (ref 0.0–1.2)
CO2: 23 mmol/L (ref 20–29)
Calcium: 9.8 mg/dL (ref 8.7–10.2)
Chloride: 99 mmol/L (ref 96–106)
Creatinine, Ser: 0.98 mg/dL (ref 0.76–1.27)
Globulin, Total: 2.3 g/dL (ref 1.5–4.5)
Glucose: 134 mg/dL — ABNORMAL HIGH (ref 70–99)
Potassium: 4.3 mmol/L (ref 3.5–5.2)
Sodium: 139 mmol/L (ref 134–144)
Total Protein: 6.9 g/dL (ref 6.0–8.5)
eGFR: 89 mL/min/1.73 (ref >59)

## 2024-10-01 LAB — HEMOGLOBIN A1C
Est. average glucose Bld gHb Est-mCnc: 111 mg/dL
Hgb A1c MFr Bld: 5.5 % (ref 4.8–5.6)

## 2024-10-01 LAB — TSH: TSH: 1.42 u[IU]/mL (ref 0.450–4.500)

## 2024-10-03 ENCOUNTER — Ambulatory Visit: Payer: Self-pay | Admitting: Cardiology

## 2024-10-04 NOTE — Progress Notes (Signed)
 Left VM to return call

## 2024-10-05 NOTE — Progress Notes (Signed)
 Left vm to return call.

## 2024-10-07 ENCOUNTER — Ambulatory Visit: Admitting: Cardiology

## 2024-10-11 NOTE — Congregational Nurse Program (Signed)
  Dept: [phone]   Congregational Nurse Program Note  Date of Encounter: 10/11/2024 Client to [facility] center with request for RN to cancel his upcoming PCP follow up appointment on 11/14 as he feels it is unnecessary. He has not yet picked up the medications prescribed at this last visit. RN will offer bus passes at next visit. BP 116/88 (BP Location: Left Arm, Patient Position: Sitting, Cuff Size: Normal)   Pulse 88   SpO2 97% . No other needs at this time. MARLA Marina BSN, RN  Past Medical History: Past Medical History:  Diagnosis Date   Essential hypertension, benign 05/21/2023   Inguinal hernia    Pancreatitis     Encounter Details:  Community Questionnaire - 10/11/24 1200       Questionnaire   Ask client: Do you give verbal consent for me to treat you today? Yes    Student Assistance [employer] Nurse    Location Patient Served  [facility]    Encounter Setting CN site    Population Status Unhoused    Insurance Medicaid   client has [employer]   Insurance/Financial Assistance Referral N/A   Medicaid application completed   Medication N/A   not currently taking medications   Medical Provider Yes   [facility]   Screening Referrals Made N/A    Medical Referrals Made N/A    Medical Appointment Completed N/A    CNP Interventions Advocate/Support;Case Management    Screenings CN Performed Blood Pressure   pulse ox improved to 92%   ED Visit Averted N/A   client sent to Urgent care   Life-Saving Intervention Made N/A

## 2024-10-14 ENCOUNTER — Ambulatory Visit: Admitting: Cardiology

## 2024-11-02 NOTE — Congregational Nurse Program (Signed)
  Dept: [phone]   Congregational Nurse Program Note  Date of Encounter: 11/02/2024 Client to [facility], nurse led clinic, with request for assistance in making a dental appointment. RN was able to make an appointment at [facility] for 12/10 at 10:00 am. RN contacted [place] [employer] Medicaid transportation, but was unable to schedule a ride. RN was told client would need an interview prior to being able to use this benefit. Client to be contacted via telephone for this interview. Client reports he is planning to go to DSS this afternoon and will attempt to discuss transportation at that time. IF Medicaid Transportaion cannot be arrange, client will be given [place] bus passes. MARLA Marina BSN, RN Past Medical History: Past Medical History:  Diagnosis Date   Essential hypertension, benign 05/21/2023   Inguinal hernia    Pancreatitis     Encounter Details:  Community Questionnaire - 11/02/24 1200       Questionnaire   Ask client: Do you give verbal consent for me to treat you today? Yes    Student Assistance N/A    Location Patient Served  [facility]    Encounter Setting CN site    Population Status Unhoused    Insurance Medicaid   client has [employer]   Insurance/Financial Assistance Referral N/A   Medicaid application completed   Medication N/A   not currently taking medications   Medical Provider Yes   [facility]   Screening Referrals Made N/A    Medical Referrals Made Dental    Medical Appointment Completed N/A    CNP Interventions Advocate/Support;Case Management    Screenings CN Performed N/A   pulse ox improved to 92%   ED Visit Averted N/A   client sent to Urgent care   Life-Saving Intervention Made N/A
